# Patient Record
Sex: Female | Born: 1995 | Race: White | Hispanic: No | Smoking: Current some day smoker
Health system: Southern US, Community
[De-identification: ages and names within clinical notes are randomized; demographics above are authoritative.]

## PROBLEM LIST (undated history)

## (undated) DIAGNOSIS — F502 Bulimia nervosa, unspecified: Secondary | ICD-10-CM

## (undated) DIAGNOSIS — K141 Geographic tongue: Secondary | ICD-10-CM

## (undated) DIAGNOSIS — F4522 Body dysmorphic disorder: Secondary | ICD-10-CM

## (undated) HISTORY — PX: OTHER SURGICAL HISTORY: SHX169

---
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
## 2016-01-30 DIAGNOSIS — R519 Headache, unspecified: Secondary | ICD-10-CM | POA: Insufficient documentation

## 2016-01-30 DIAGNOSIS — R51 Headache: Secondary | ICD-10-CM

## 2016-07-31 DIAGNOSIS — O901 Disruption of perineal obstetric wound: Secondary | ICD-10-CM | POA: Insufficient documentation

## 2016-08-01 DIAGNOSIS — Z8759 Personal history of other complications of pregnancy, childbirth and the puerperium: Secondary | ICD-10-CM | POA: Insufficient documentation

## 2017-03-18 ENCOUNTER — Encounter: Payer: Self-pay | Admitting: Obstetrics & Gynecology

## 2017-03-18 ENCOUNTER — Ambulatory Visit (INDEPENDENT_AMBULATORY_CARE_PROVIDER_SITE_OTHER): Payer: Medicaid Other | Admitting: Obstetrics & Gynecology

## 2017-03-18 ENCOUNTER — Other Ambulatory Visit (HOSPITAL_COMMUNITY)
Admission: RE | Admit: 2017-03-18 | Discharge: 2017-03-18 | Disposition: A | Discharge status: Home / Self Care | Payer: Medicaid Other | Source: Ambulatory Visit | Attending: Obstetrics & Gynecology | Admitting: Obstetrics & Gynecology

## 2017-03-18 DIAGNOSIS — Z3481 Encounter for supervision of other normal pregnancy, first trimester: Secondary | ICD-10-CM | POA: Diagnosis not present

## 2017-03-18 DIAGNOSIS — Z3689 Encounter for other specified antenatal screening: Secondary | ICD-10-CM

## 2017-03-18 DIAGNOSIS — Z3490 Encounter for supervision of normal pregnancy, unspecified, unspecified trimester: Secondary | ICD-10-CM | POA: Insufficient documentation

## 2017-03-18 DIAGNOSIS — Z3480 Encounter for supervision of other normal pregnancy, unspecified trimester: Secondary | ICD-10-CM | POA: Diagnosis not present

## 2017-03-18 DIAGNOSIS — Z3A08 8 weeks gestation of pregnancy: Secondary | ICD-10-CM | POA: Insufficient documentation

## 2017-03-18 DIAGNOSIS — I10 Essential (primary) hypertension: Secondary | ICD-10-CM

## 2017-03-18 MED ORDER — DOXYLAMINE-PYRIDOXINE 10-10 MG PO TBEC: 2.0000 | DELAYED_RELEASE_TABLET | Freq: Every day | ORAL | 2 refills | Status: DC

## 2017-03-18 NOTE — Progress Notes (Signed)
BABYSCRIPTS PATIENT: [x ] initial,  12,  20,  28,  32,  36,  38,  39,  40  Strong family history of breast cancer.

## 2017-03-18 NOTE — Progress Notes (Signed)
Bedside U/S shows IUP with FHT of 150 BPM and CRL is 13.6mm  GA [redacted]w[redacted]d.  Pt does desire 1 trimester screening

## 2017-03-19 ENCOUNTER — Encounter: Payer: Self-pay | Admitting: Obstetrics & Gynecology

## 2017-03-19 DIAGNOSIS — O26899 Other specified pregnancy related conditions, unspecified trimester: Secondary | ICD-10-CM

## 2017-03-19 DIAGNOSIS — Z6791 Unspecified blood type, Rh negative: Secondary | ICD-10-CM | POA: Insufficient documentation

## 2017-03-19 LAB — PRENATAL PROFILE (SOLSTAS)
Antibody Screen: NEGATIVE
Basophils Absolute: 0 cells/uL (ref 0–200)
Basophils Relative: 0 %
EOS PCT: 1 %
Eosinophils Absolute: 102 cells/uL (ref 15–500)
HEMATOCRIT: 34.7 % — AB (ref 35.0–45.0)
HEMOGLOBIN: 10.8 g/dL — AB (ref 11.7–15.5)
HIV 1&2 Ab, 4th Generation: NONREACTIVE
Hepatitis B Surface Ag: NEGATIVE
LYMPHS PCT: 13 %
Lymphs Abs: 1326 cells/uL (ref 850–3900)
MCH: 21.4 pg — ABNORMAL LOW (ref 27.0–33.0)
MCHC: 31.1 g/dL — ABNORMAL LOW (ref 32.0–36.0)
MCV: 68.8 fL — ABNORMAL LOW (ref 80.0–100.0)
MPV: 9.3 fL (ref 7.5–12.5)
Monocytes Absolute: 612 cells/uL (ref 200–950)
Monocytes Relative: 6 %
NEUTROS PCT: 80 %
Neutro Abs: 8160 cells/uL — ABNORMAL HIGH (ref 1500–7800)
Platelets: 409 10*3/uL — ABNORMAL HIGH (ref 140–400)
RBC: 5.04 MIL/uL (ref 3.80–5.10)
RDW: 17.9 % — AB (ref 11.0–15.0)
RH TYPE: NEGATIVE
RUBELLA: 2.62 {index} — AB (ref <0.90)
WBC: 10.2 10*3/uL (ref 3.8–10.8)

## 2017-03-20 ENCOUNTER — Encounter: Payer: Self-pay | Admitting: Obstetrics & Gynecology

## 2017-03-20 DIAGNOSIS — Z803 Family history of malignant neoplasm of breast: Secondary | ICD-10-CM | POA: Insufficient documentation

## 2017-03-20 LAB — URINE CYTOLOGY ANCILLARY ONLY
CHLAMYDIA, DNA PROBE: NEGATIVE
NEISSERIA GONORRHEA: NEGATIVE

## 2017-03-20 LAB — CULTURE, OB URINE

## 2017-03-22 NOTE — Progress Notes (Signed)
Subjective:    Katrina Fuller is a G2P1001 [redacted]w[redacted]d being seen today for her first obstetrical visit.  Her obstetrical history is significant for obesity, hitroy of 4th degree laceration and breakdown.  (no incontinence). Pregnancy history fully reviewed.  [facility] has HTN on their problem list but not hypertensive here.  Will watch carefully.  Patient reports no complaints.  Vitals:   03/18/17 0952 03/18/17 0954  BP: 115/76   Pulse: 78   Weight: 172 lb (78 kg)   Height:  5' (1.524 m)    HISTORY: OB History  Gravida Para Term Preterm AB Living  SAB TAB Ectopic Multiple Live Births               # Outcome Date GA Lbr Len/2nd Weight Sex Delivery Anes PTL Lv  2 Current           1 Term 07/23/16   8 lb 1 oz (3.657 kg) M Vag-Spont        History reviewed. No pertinent past medical history. Past Surgical History:  Procedure Laterality Date  . chin surgery     5 surgeries for cyst on chin   Family History  Problem Relation Age of Onset  . Diabetes Mother   . Hypertension Mother   . Diverticulitis Mother   . Multiple sclerosis Father   . Diabetes Father   . Hypertension Father   . Diverticulitis Father   . Breast cancer Sister   . Breast cancer Paternal Grandmother   . Breast cancer Paternal Aunt     4 P aunts with breast cancer     Exam    Uterus:     Pelvic Exam:    Perineum: No Hemorrhoids   Vulva: normal   Vagina:  normal mucosa, normal discharge   pH: n/a   Cervix: no lesions   Adnexa: normal adnexa   Bony Pelvis: average  System: Breast:  normal appearance, no masses or tenderness   Skin: normal coloration and turgor, no rashes    Neurologic: oriented, normal mood   Extremities: normal strength, tone, and muscle mass   HEENT sclera clear, anicteric, oropharynx clear, no lesions, neck supple with midline trachea, thyroid without masses and trachea midline   Mouth/Teeth mucous membranes moist, pharynx normal without lesions and dental hygiene poor    Neck supple and no masses   Cardiovascular: regular rate and rhythm   Respiratory:  appears well, vitals normal, no respiratory distress, acyanotic, normal RR, chest clear, no wheezing, crepitations, rhonchi, normal symmetric air entry   Abdomen: soft, non-tender; bowel sounds normal; no masses,  no organomegaly   Urinary: urethral meatus normal      Assessment:    Pregnancy: G2P1001 Patient Active Problem List   Diagnosis Date Noted  . Family history of breast cancer 03/20/2017  . Rh negative state in antepartum period 03/19/2017  . Supervision of other normal pregnancy, antepartum 03/18/2017  . Hypertension 08/01/2016  . Fourth degree laceration of perineum during delivery, postpartum 07/31/2016  . Postpartum episiotomy dehiscence 07/31/2016  . Nonintractable episodic headache 01/30/2016        Plan:     Initial labs drawn. Prenatal vitamins. Problem list reviewed and updated. Genetic Screening discussed First Screen: ordered.  Ultrasound discussed; fetal survey: requested.  Follow up in 4 weeks.  Ask patient more details about family breast cancer Babyscripts reduced visit Dental referral [facility]--?Hyertention on their problem list.  Will watch carefully and inquire at  next visit.     Katrina Fuller 03/22/2017

## 2017-03-26 ENCOUNTER — Encounter: Payer: Self-pay | Admitting: Obstetrics & Gynecology

## 2017-03-30 ENCOUNTER — Encounter: Payer: Self-pay | Admitting: *Deleted

## 2017-04-15 ENCOUNTER — Ambulatory Visit (INDEPENDENT_AMBULATORY_CARE_PROVIDER_SITE_OTHER): Payer: Medicaid Other | Admitting: Obstetrics & Gynecology

## 2017-04-15 ENCOUNTER — Encounter: Payer: Self-pay | Admitting: Obstetrics & Gynecology

## 2017-04-15 VITALS — BP 111/77 | HR 80 | Wt 165.0 lb

## 2017-04-15 DIAGNOSIS — Z3481 Encounter for supervision of other normal pregnancy, first trimester: Secondary | ICD-10-CM

## 2017-04-15 DIAGNOSIS — Z8632 Personal history of gestational diabetes: Secondary | ICD-10-CM

## 2017-04-15 DIAGNOSIS — Z3480 Encounter for supervision of other normal pregnancy, unspecified trimester: Secondary | ICD-10-CM

## 2017-04-15 NOTE — Progress Notes (Signed)
   PRENATAL VISIT NOTE  Subjective:  Katrina Fuller is a 21 y.o. G2P1001 at 8465w5d being seen today for ongoing prenatal care.  She is currently monitored for the following issues for this low-risk pregnancy and has Supervision of other normal pregnancy, antepartum; Rh negative state in antepartum period; Family history of breast cancer; Fourth degree laceration of perineum during delivery, postpartum; Nonintractable episodic headache; Postpartum episiotomy dehiscence; Hypertension; and History of diet controlled gestational diabetes mellitus (GDM) on her problem list.  Patient reports no complaints.  Contractions: Not present. Vag. Bleeding: None.  Movement: Absent. Denies leaking of fluid.   The following portions of the patient's history were reviewed and updated as appropriate: allergies, current medications, past family history, past medical history, past social history, past surgical history and problem list. Problem list updated.  Objective:   Vitals:   04/15/17 0858  BP: 111/77  Pulse: 80  Weight: 165 lb (74.8 kg)    Fetal Status: Fetal Heart Rate (bpm): 159   Movement: Absent     General:  Alert, oriented and cooperative. Patient is in no acute distress.  Skin: Skin is warm and dry. No rash noted.   Cardiovascular: Normal heart rate noted  Respiratory: Normal respiratory effort, no problems with respiration noted  Abdomen: Soft, gravid, appropriate for gestational age. Pain/Pressure: Absent     Pelvic:  Cervical exam deferred        Extremities: Normal range of motion.  Edema: None  Mental Status: Normal mood and affect. Normal behavior. Normal judgment and thought content.   Assessment and Plan:  Pregnancy: G2P1001 at 465w5d  1. Supervision of other normal pregnancy, antepartum - We discussed genetic testing. She declines.  2. History of diet controlled gestational diabetes mellitus (GDM)  - Hemoglobin A1c  Preterm labor symptoms and general obstetric precautions  including but not limited to vaginal bleeding, contractions, leaking of fluid and fetal movement were reviewed in detail with the patient. Please refer to After Visit Summary for other counseling recommendations.  Return in about 4 weeks (around 05/13/2017).   Allie Bossierove, Ledora Delker C, MD

## 2017-04-16 LAB — HEMOGLOBIN A1C
HEMOGLOBIN A1C: 5.3 % (ref <5.7)
Mean Plasma Glucose: 105 mg/dL

## 2017-05-05 ENCOUNTER — Other Ambulatory Visit: Payer: Self-pay

## 2017-05-05 ENCOUNTER — Encounter: Payer: Self-pay | Admitting: Obstetrics & Gynecology

## 2017-05-05 DIAGNOSIS — Z3A20 20 weeks gestation of pregnancy: Secondary | ICD-10-CM

## 2017-05-26 ENCOUNTER — Encounter: Payer: Self-pay | Admitting: Obstetrics & Gynecology

## 2017-06-08 ENCOUNTER — Ambulatory Visit (HOSPITAL_COMMUNITY)
Admission: RE | Admit: 2017-06-08 | Discharge: 2017-06-08 | Disposition: A | Discharge status: Home / Self Care | Payer: Medicaid Other | Source: Ambulatory Visit | Attending: Obstetrics & Gynecology | Admitting: Obstetrics & Gynecology

## 2017-06-08 ENCOUNTER — Ambulatory Visit (HOSPITAL_COMMUNITY): Payer: Medicaid Other

## 2017-06-08 ENCOUNTER — Other Ambulatory Visit: Payer: Self-pay | Admitting: Obstetrics & Gynecology

## 2017-06-08 DIAGNOSIS — Z3A20 20 weeks gestation of pregnancy: Secondary | ICD-10-CM

## 2017-06-08 DIAGNOSIS — O99212 Obesity complicating pregnancy, second trimester: Secondary | ICD-10-CM

## 2017-06-08 DIAGNOSIS — Z3A19 19 weeks gestation of pregnancy: Secondary | ICD-10-CM | POA: Diagnosis not present

## 2017-06-08 DIAGNOSIS — Z6832 Body mass index (BMI) 32.0-32.9, adult: Secondary | ICD-10-CM | POA: Insufficient documentation

## 2017-06-08 DIAGNOSIS — Z363 Encounter for antenatal screening for malformations: Secondary | ICD-10-CM

## 2017-06-10 ENCOUNTER — Ambulatory Visit (INDEPENDENT_AMBULATORY_CARE_PROVIDER_SITE_OTHER): Payer: Medicaid Other | Admitting: Obstetrics & Gynecology

## 2017-06-10 ENCOUNTER — Encounter: Payer: Self-pay | Admitting: Obstetrics & Gynecology

## 2017-06-10 VITALS — BP 95/63 | HR 91 | Wt 164.0 lb

## 2017-06-10 DIAGNOSIS — Z8632 Personal history of gestational diabetes: Secondary | ICD-10-CM

## 2017-06-10 DIAGNOSIS — Z8759 Personal history of other complications of pregnancy, childbirth and the puerperium: Secondary | ICD-10-CM

## 2017-06-10 DIAGNOSIS — Z0489 Encounter for examination and observation for other specified reasons: Secondary | ICD-10-CM

## 2017-06-10 DIAGNOSIS — IMO0002 Reserved for concepts with insufficient information to code with codable children: Secondary | ICD-10-CM

## 2017-06-10 DIAGNOSIS — Z3402 Encounter for supervision of normal first pregnancy, second trimester: Secondary | ICD-10-CM

## 2017-06-10 MED ORDER — ASPIRIN EC 81 MG PO TBEC: 81.0000 mg | DELAYED_RELEASE_TABLET | Freq: Every day | ORAL | 2 refills | Status: DC

## 2017-06-10 NOTE — Progress Notes (Signed)
PRENATAL VISIT NOTE  Subjective:  Katrina Fuller is a 21 y.o. G2P1001 at 3554w5d being seen today for ongoing prenatal care.  She is currently monitored for the following issues for this low-risk pregnancy and has Supervision of normal pregnancy; Rh negative state in antepartum period; Family history of breast cancer; Fourth degree laceration of perineum during delivery, postpartum; Nonintractable episodic headache; Postpartum episiotomy dehiscence; History of gestational hypertension; and History of diet controlled gestational diabetes mellitus (GDM) on her problem list.  Patient reports no complaints.  Contractions: Not present. Vag. Bleeding: None.  Movement: Present. Denies leaking of fluid.   The following portions of the patient's history were reviewed and updated as appropriate: allergies, current medications, past family history, past medical history, past social history, past surgical history and problem list. Problem list updated.  Objective:   Vitals:   06/10/17 0835  BP: 95/63  Pulse: 91  Weight: 164 lb (74.4 kg)    Fetal Status: Fetal Heart Rate (bpm): 145 Fundal Height: 20 cm Movement: Present     General:  Alert, oriented and cooperative. Patient is in no acute distress.  Skin: Skin is warm and dry. No rash noted.   Cardiovascular: Normal heart rate noted  Respiratory: Normal respiratory effort, no problems with respiration noted  Abdomen: Soft, gravid, appropriate for gestational age. Pain/Pressure: Absent     Pelvic:  Cervical exam deferred        Extremities: Normal range of motion.     Mental Status: Normal mood and affect. Normal behavior. Normal judgment and thought content.   [place] Mfm Ob Detail +14 Wk  Result Date: 06/08/2017 ----------------------------------------------------------------------  OBSTETRICS REPORT                      (Signed Final 06/08/2017 06:37 pm) ---------------------------------------------------------------------- Patient Info  ID #:        [id number]                         D.O.B.:   May 23, 1996 (20 yrs)  Name:       Katrina Fuller                 Visit Date:  06/08/2017 04:17 pm ---------------------------------------------------------------------- Performed By  Performed By:     Emeline DarlingKasie E Kiser BS,      Ref. Address:     [address]                    [address]                                                             [address]                                                             [place], [place]  [address]  Attending:        Particia Nearing MD       Location:         [facility]  Referred By:      Tereso Newcomer MD ---------------------------------------------------------------------- Orders   #  Description                                 Code   1  [place] MFM OB DETAIL +14 WK                     76811.01  ----------------------------------------------------------------------   #  Ordered By               Order #        Accession #    Episode #   1  Jaynie Collins           [id number]       [id number]     [id number]  ---------------------------------------------------------------------- Indications   [redacted] weeks gestation of pregnancy                Z3A.19   Encounter for antenatal screening for          Z36.3   malformations   Obesity complicating pregnancy, second         O99.212   trimester  ---------------------------------------------------------------------- OB History  Blood Type:            Height:  5'0"   Weight (lb):  165      BMI:   32.22  Gravidity:    2         Term:   1        Prem:   0        SAB:   0  TOP:          0       Ectopic:  0        Living: 1 ---------------------------------------------------------------------- Fetal Evaluation  Num Of Fetuses:     1  Fetal Heart         150  Rate(bpm):  Cardiac Activity:   Observed  Presentation:       Breech  Placenta:           Anterior, above cervical os  P. Cord Insertion:  Visualized  Amniotic Fluid  AFI FV:       Subjectively within normal limits                              Largest Pocket(cm)                              4.8 ---------------------------------------------------------------------- Biometry  BPD:      44.2  mm     G. Age:  19w 3d         48  %    CI:         68.7   %   70 - 86  FL/HC:      16.9   %   16.1 - 18.3  HC:      170.4  mm     G. Age:  19w 4d         53  %    HC/AC:      1.15       1.09 - 1.39  AC:      147.8  mm     G. Age:  20w 1d         66  %    FL/BPD:     65.2   %  FL:       28.8  mm     G. Age:  18w 6d         23  %    FL/AC:      19.5   %   20 - 24  NFT:       4.6  mm  Est. FW:     296  gm    0 lb 10 oz      48  % ---------------------------------------------------------------------- Gestational Age  LMP:           25w 0d       Date:   12/15/16                 EDD:   09/21/17  U/S Today:     19w 4d                                        EDD:   10/29/17  Best:          19w 3d    Det. ByMarcella Dubs         EDD:   10/30/17                                      (03/18/17) ---------------------------------------------------------------------- Anatomy  Cranium:               Appears normal         Aortic Arch:            Appears normal  Cavum:                 Appears normal         Ductal Arch:            Appears normal  Ventricles:            Appears normal         Diaphragm:              Appears normal  Choroid Plexus:        Appears normal         Stomach:                Appears normal, left                                                                        sided  Cerebellum:  Appears normal         Abdomen:                Appears normal  Posterior Fossa:       Appears normal         Abdominal Wall:         Appears nml (cord                                                                        insert, abd wall)  Nuchal Fold:           Appears normal         Cord Vessels:           Appears normal (3                                                                         vessel cord)  Face:                  Appears normal         Kidneys:                Appear normal                         (orbits and profile)  Lips:                  Appears normal         Bladder:                Appears normal  Thoracic:              Appears normal         Spine:                  Not well visualized  Heart:                 Appears normal         Upper Extremities:      Appears normal                         (4CH, axis, and situs  RVOT:                  Appears normal         Lower Extremities:      Not well visualized  LVOT:                  Appears normal  Other:  Female gender. Heels and 5th digit visualized. Technically difficult          due to maternal habitus and fetal position. ---------------------------------------------------------------------- Cervix Uterus Adnexa  Cervix  Length:            3.9  cm.  Normal appearance by transabdominal scan.  Adnexa:  No abnormality visualized. ---------------------------------------------------------------------- Impression  SIUP at 19+3 weeks  Normal detailed fetal anatomy; limited views of spine  Markers of aneuploidy: none  Normal amniotic fluid volume  Measurements consistent with prior US ---------------------------------------------------------------------- Recommendations  Follow-up ultrasound in 4-6 weeks to complete anatomy  survey or follow-up as clinically indicated ----------------------------------------------------------------------                 Particia Nearing, MD Electronically Signed Final Report   06/08/2017 06:37 pm ----------------------------------------------------------------------   Assessment and Plan:  Pregnancy: G2P1001 at [redacted]w[redacted]d  1. History of gestational hypertension Happened in last month of previous pregnancy; she does not think she had preeclampsia. Aspirin 81 mg prescribed this pregnancy.  - aspirin EC 81 MG tablet; Take 1 tablet (81 mg total) by mouth  daily. Take after 12 weeks for prevention of preeclampsia later in pregnancy  Dispense: 300 tablet; Refill: 2  2. Evaluate anatomy not seen on prior sonogram Limited spine on previous scan. - [place] MFM OB FOLLOW UP; Future  3. Encounter for supervision of normal first pregnancy in second trimester Declines quad screen or other genetic screening. Preterm labor symptoms and general obstetric precautions including but not limited to vaginal bleeding, contractions, leaking of fluid and fetal movement were reviewed in detail with the patient. Please refer to After Visit Summary for other counseling recommendations.  Return in about 4 weeks (around 07/08/2017) for OB Visit.   Jaynie Collins, MD

## 2017-06-10 NOTE — Patient Instructions (Signed)
Return to clinic for any scheduled appointments or obstetric concerns, or go to [facility] for evaluation    Second Trimester of Pregnancy The second trimester is from week 14 through week 27 (months 4 through 6). The second trimester is often a time when you feel your best. Your body has adjusted to being pregnant, and you begin to feel better physically. Usually, morning sickness has lessened or quit completely, you may have more energy, and you may have an increase in appetite. The second trimester is also a time when the fetus is growing rapidly. At the end of the sixth month, the fetus is about 9 inches long and weighs about 1 pounds. You will likely begin to feel the baby move (quickening) between 16 and 20 weeks of pregnancy. Body changes during your second trimester Your body continues to go through many changes during your second trimester. The changes vary from woman to woman.  Your weight will continue to increase. You will notice your lower abdomen bulging out.  You may begin to get stretch marks on your hips, abdomen, and breasts.  You may develop headaches that can be relieved by medicines. The medicines should be approved by your health care provider.  You may urinate more often because the fetus is pressing on your bladder.  You may develop or continue to have heartburn as a result of your pregnancy.  You may develop constipation because certain hormones are causing the muscles that push waste through your intestines to slow down.  You may develop hemorrhoids or swollen, bulging veins (varicose veins).  You may have back pain. This is caused by: ? Weight gain. ? Pregnancy hormones that are relaxing the joints in your pelvis. ? A shift in weight and the muscles that support your balance.  Your breasts will continue to grow and they will continue to become tender.  Your gums may bleed and may be sensitive to brushing and flossing.  Dark spots or blotches (chloasma, mask of  pregnancy) may develop on your face. This will likely fade after the baby is born.  A dark line from your belly button to the pubic area (linea nigra) may appear. This will likely fade after the baby is born.  You may have changes in your hair. These can include thickening of your hair, rapid growth, and changes in texture. Some women also have hair loss during or after pregnancy, or hair that feels dry or thin. Your hair will most likely return to normal after your baby is born.  What to expect at prenatal visits During a routine prenatal visit:  You will be weighed to make sure you and the fetus are growing normally.  Your blood pressure will be taken.  Your abdomen will be measured to track your baby's growth.  The fetal heartbeat will be listened to.  Any test results from the previous visit will be discussed.  Your health care provider may ask you:  How you are feeling.  If you are feeling the baby move.  If you have had any abnormal symptoms, such as leaking fluid, bleeding, severe headaches, or abdominal cramping.  If you are using any tobacco products, including cigarettes, chewing tobacco, and electronic cigarettes.  If you have any questions.  Other tests that may be performed during your second trimester include:  Blood tests that check for: ? Low iron levels (anemia). ? High blood sugar that affects pregnant women (gestational diabetes) between 24 and 28 weeks. ? Rh antibodies. This is to   check for a protein on red blood cells (Rh factor).  Urine tests to check for infections, diabetes, or protein in the urine.  An ultrasound to confirm the proper growth and development of the baby.  An amniocentesis to check for possible genetic problems.  Fetal screens for spina bifida and Down syndrome.  HIV (human immunodeficiency virus) testing. Routine prenatal testing includes screening for HIV, unless you choose not to have this test.  Follow these instructions at  home: Medicines  Follow your health care provider's instructions regarding medicine use. Specific medicines may be either safe or unsafe to take during pregnancy.  Take a prenatal vitamin that contains at least 600 micrograms (mcg) of folic acid.  If you develop constipation, try taking a stool softener if your health care provider approves. Eating and drinking  Eat a balanced diet that includes fresh fruits and vegetables, whole grains, good sources of protein such as meat, eggs, or tofu, and low-fat dairy. Your health care provider will help you determine the amount of weight gain that is right for you.  Avoid raw meat and uncooked cheese. These carry germs that can cause birth defects in the baby.  If you have low calcium intake from food, talk to your health care provider about whether you should take a daily calcium supplement.  Limit foods that are high in fat and processed sugars, such as fried and sweet foods.  To prevent constipation: ? Drink enough fluid to keep your urine clear or pale yellow. ? Eat foods that are high in fiber, such as fresh fruits and vegetables, whole grains, and beans. Activity  Exercise only as directed by your health care provider. Most women can continue their usual exercise routine during pregnancy. Try to exercise for 30 minutes at least 5 days a week. Stop exercising if you experience uterine contractions.  Avoid heavy lifting, wear low heel shoes, and practice good posture.  A sexual relationship may be continued unless your health care provider directs you otherwise. Relieving pain and discomfort  Wear a good support bra to prevent discomfort from breast tenderness.  Take warm sitz baths to soothe any pain or discomfort caused by hemorrhoids. Use hemorrhoid cream if your health care provider approves.  Rest with your legs elevated if you have leg cramps or low back pain.  If you develop varicose veins, wear support hose. Elevate your feet  for 15 minutes, 3-4 times a day. Limit salt in your diet. Prenatal Care  Write down your questions. Take them to your prenatal visits.  Keep all your prenatal visits as told by your health care provider. This is important. Safety  Wear your seat belt at all times when driving.  Make a list of emergency phone numbers, including numbers for family, friends, the hospital, and police and fire departments. General instructions  Ask your health care provider for a referral to a local prenatal education class. Begin classes no later than the beginning of month 6 of your pregnancy.  Ask for help if you have counseling or nutritional needs during pregnancy. Your health care provider can offer advice or refer you to specialists for help with various needs.  Do not use hot tubs, steam rooms, or saunas.  Do not douche or use tampons or scented sanitary pads.  Do not cross your legs for long periods of time.  Avoid cat litter boxes and soil used by cats. These carry germs that can cause birth defects in the baby and possibly loss of the   fetus by miscarriage or stillbirth.  Avoid all smoking, herbs, alcohol, and unprescribed drugs. Chemicals in these products can affect the formation and growth of the baby.  Do not use any products that contain nicotine or tobacco, such as cigarettes and e-cigarettes. If you need help quitting, ask your health care provider.  Visit your dentist if you have not gone yet during your pregnancy. Use a soft toothbrush to brush your teeth and be gentle when you floss. Contact a health care provider if:  You have dizziness.  You have mild pelvic cramps, pelvic pressure, or nagging pain in the abdominal area.  You have persistent nausea, vomiting, or diarrhea.  You have a bad smelling vaginal discharge.  You have pain when you urinate. Get help right away if:  You have a fever.  You are leaking fluid from your vagina.  You have spotting or bleeding from your  vagina.  You have severe abdominal cramping or pain.  You have rapid weight gain or weight loss.  You have shortness of breath with chest pain.  You notice sudden or extreme swelling of your face, hands, ankles, feet, or legs.  You have not felt your baby move in over an hour.  You have severe headaches that do not go away when you take medicine.  You have vision changes. Summary  The second trimester is from week 14 through week 27 (months 4 through 6). It is also a time when the fetus is growing rapidly.  Your body goes through many changes during pregnancy. The changes vary from woman to woman.  Avoid all smoking, herbs, alcohol, and unprescribed drugs. These chemicals affect the formation and growth your baby.  Do not use any tobacco products, such as cigarettes, chewing tobacco, and e-cigarettes. If you need help quitting, ask your health care provider.  Contact your health care provider if you have any questions. Keep all prenatal visits as told by your health care provider. This is important. This information is not intended to replace advice given to you by your health care provider. Make sure you discuss any questions you have with your health care provider. Document Released: 11/11/2001 Document Revised: 04/24/2016 Document Reviewed: 01/18/2013 [employer] Interactive Patient Education  2017 [employer]  

## 2017-07-08 ENCOUNTER — Encounter: Payer: Medicaid Other | Admitting: Obstetrics & Gynecology

## 2017-07-10 ENCOUNTER — Ambulatory Visit (HOSPITAL_COMMUNITY)
Admission: RE | Admit: 2017-07-10 | Discharge: 2017-07-10 | Disposition: A | Discharge status: Home / Self Care | Payer: Medicaid Other | Source: Ambulatory Visit | Attending: Obstetrics & Gynecology | Admitting: Obstetrics & Gynecology

## 2017-07-10 DIAGNOSIS — Z048 Encounter for examination and observation for other specified reasons: Secondary | ICD-10-CM | POA: Insufficient documentation

## 2017-07-10 DIAGNOSIS — Z3A24 24 weeks gestation of pregnancy: Secondary | ICD-10-CM | POA: Insufficient documentation

## 2017-07-10 DIAGNOSIS — Z0489 Encounter for examination and observation for other specified reasons: Secondary | ICD-10-CM

## 2017-07-10 DIAGNOSIS — IMO0002 Reserved for concepts with insufficient information to code with codable children: Secondary | ICD-10-CM

## 2017-07-15 ENCOUNTER — Encounter: Payer: Self-pay | Admitting: Obstetrics & Gynecology

## 2017-07-15 ENCOUNTER — Ambulatory Visit (INDEPENDENT_AMBULATORY_CARE_PROVIDER_SITE_OTHER): Payer: Medicaid Other | Admitting: Obstetrics & Gynecology

## 2017-07-15 DIAGNOSIS — Z3482 Encounter for supervision of other normal pregnancy, second trimester: Secondary | ICD-10-CM

## 2017-07-15 DIAGNOSIS — Z3480 Encounter for supervision of other normal pregnancy, unspecified trimester: Secondary | ICD-10-CM

## 2017-07-15 NOTE — Progress Notes (Signed)
   PRENATAL VISIT NOTE  Subjective:  Ellouise NewerHannah Manny is a 21 y.o. G2P1001 at 2834w5d being seen today for ongoing prenatal care.  She is currently monitored for the following issues for this low-risk pregnancy and has Supervision of normal pregnancy; Rh negative state in antepartum period; Family history of breast cancer; Fourth degree laceration of perineum during delivery, postpartum; Nonintractable episodic headache; Postpartum episiotomy dehiscence; History of gestational hypertension; and History of diet controlled gestational diabetes mellitus (GDM) on her problem list.  Patient reports no complaints.  Contractions: Not present. Vag. Bleeding: None.  Movement: Present. Denies leaking of fluid.   The following portions of the patient's history were reviewed and updated as appropriate: allergies, current medications, past family history, past medical history, past social history, past surgical history and problem list. Problem list updated.  Objective:   Vitals:   07/15/17 0855  BP: 103/64  Pulse: 96  Weight: 169 lb (76.7 kg)    Fetal Status: Fetal Heart Rate (bpm): 150   Movement: Present     General:  Alert, oriented and cooperative. Patient is in no acute distress.  Skin: Skin is warm and dry. No rash noted.   Cardiovascular: Normal heart rate noted  Respiratory: Normal respiratory effort, no problems with respiration noted  Abdomen: Soft, gravid, appropriate for gestational age.  Pain/Pressure: Absent     Pelvic: Cervical exam deferred        Extremities: Normal range of motion.  Edema: None  Mental Status:  Normal mood and affect. Normal behavior. Normal judgment and thought content.   Assessment and Plan:  Pregnancy: G2P1001 at 5134w5d  1. Supervision of other normal pregnancy, antepartum Taking BP, but not as often.  Pt's last recording is 7/31.  Pt will take 1-2 times per week.   RN to check database next Friday to see compliance Pt wants Brachs jelly beans GDM test.   Will  do at 28 weeks Tdap and Rhogam next visit.   Preterm labor symptoms and general obstetric precautions including but not limited to vaginal bleeding, contractions, leaking of fluid and fetal movement were reviewed in detail with the patient. Please refer to After Visit Summary for other counseling recommendations.   RTC 4 weeks.  Elsie LincolnKelly Mathea Frieling, MD

## 2017-07-21 ENCOUNTER — Ambulatory Visit (INDEPENDENT_AMBULATORY_CARE_PROVIDER_SITE_OTHER): Payer: Medicaid Other | Admitting: Obstetrics and Gynecology

## 2017-07-21 ENCOUNTER — Encounter: Payer: Self-pay | Admitting: *Deleted

## 2017-07-21 ENCOUNTER — Encounter: Payer: Self-pay | Admitting: Obstetrics & Gynecology

## 2017-07-21 VITALS — BP 101/70 | HR 83 | Temp 97.3°F | Wt 171.0 lb

## 2017-07-21 DIAGNOSIS — O09899 Supervision of other high risk pregnancies, unspecified trimester: Secondary | ICD-10-CM

## 2017-07-21 DIAGNOSIS — Z3482 Encounter for supervision of other normal pregnancy, second trimester: Secondary | ICD-10-CM

## 2017-07-21 DIAGNOSIS — O26899 Other specified pregnancy related conditions, unspecified trimester: Secondary | ICD-10-CM

## 2017-07-21 DIAGNOSIS — Z6791 Unspecified blood type, Rh negative: Secondary | ICD-10-CM

## 2017-07-21 DIAGNOSIS — R319 Hematuria, unspecified: Secondary | ICD-10-CM

## 2017-07-21 DIAGNOSIS — Z8632 Personal history of gestational diabetes: Secondary | ICD-10-CM

## 2017-07-21 DIAGNOSIS — Z8759 Personal history of other complications of pregnancy, childbirth and the puerperium: Secondary | ICD-10-CM

## 2017-07-21 MED ORDER — AMOXICILLIN-POT CLAVULANATE 875-125 MG PO TABS: 1.0000 | ORAL_TABLET | Freq: Two times a day (BID) | ORAL | 1 refills | Status: DC

## 2017-07-21 NOTE — Progress Notes (Signed)
   PRENATAL VISIT NOTE  Subjective:  Katrina Fuller is a 21 y.o. G2P1001 at [redacted]w[redacted]d being seen today for ongoing prenatal care.  She is currently monitored for the following issues for this low-risk pregnancy and has Supervision of normal pregnancy; Rh negative state in antepartum period; Family history of breast cancer; Fourth degree laceration of perineum during delivery, postpartum; Nonintractable episodic headache; Postpartum episiotomy dehiscence; History of gestational hypertension; and History of diet controlled gestational diabetes mellitus (GDM) on her problem list.  Patient reports tooth ache worst since last night. Patient with history of poor dentition with painful inferior right molar. Patient has been drinking protein shakes as it is to painful to chew .  Contractions: Regular. Vag. Bleeding: None.  Movement: Present. Denies leaking of fluid.   The following portions of the patient's history were reviewed and updated as appropriate: allergies, current medications, past family history, past medical history, past social history, past surgical history and problem list. Problem list updated.  Objective:   Vitals:   07/21/17 1313  BP: 101/70  Pulse: 83  Temp: (!) 97.3 F (36.3 C)  Weight: 171 lb (77.6 kg)    Fetal Status: Fetal Heart Rate (bpm): 151 Fundal Height: 26 cm Movement: Present     General:  Alert, oriented and cooperative. Patient is in no acute distress.  Skin: Skin is warm and dry. No rash noted.   Cardiovascular: Normal heart rate noted  Respiratory: Normal respiratory effort, no problems with respiration noted  Abdomen: Soft, gravid, appropriate for gestational age.  Pain/Pressure: Absent     Pelvic: Cervical exam deferred        Extremities: Normal range of motion.  Edema: None  Mental Status:  Normal mood and affect. Normal behavior. Normal judgment and thought content.  MOUTH: Complete erosion of tooth enamel on inferior right molar with associated  gingivits  Assessment and Plan:  Pregnancy: G2P1001 at [redacted]w[redacted]d  1. Encounter for supervision of other normal pregnancy in second trimester Rx Augmentin provided - Patient to follow up with dentist on Thursday - Continue liquid diet  2. History of diet controlled gestational diabetes mellitus (GDM) glucola next visit  3. History of gestational hypertension Continue ASA  4. Rh negative state in antepartum period Rhogam at next visit  5. Hematuria, unspecified type - CULTURE, URINE COMPREHENSIVE  Preterm labor symptoms and general obstetric precautions including but not limited to vaginal bleeding, contractions, leaking of fluid and fetal movement were reviewed in detail with the patient. Please refer to After Visit Summary for other counseling recommendations.  No Follow-up on file.   Catalina Antigua, MD

## 2017-07-21 NOTE — Progress Notes (Signed)
Poss abscess tooth,  Dentist appt not until next Thursday.  Urine dip mod-blood

## 2017-07-23 LAB — CULTURE, URINE COMPREHENSIVE: ORGANISM ID, BACTERIA: NO GROWTH

## 2017-08-12 ENCOUNTER — Ambulatory Visit (INDEPENDENT_AMBULATORY_CARE_PROVIDER_SITE_OTHER): Payer: Medicaid Other | Admitting: Obstetrics & Gynecology

## 2017-08-12 DIAGNOSIS — Z3483 Encounter for supervision of other normal pregnancy, third trimester: Secondary | ICD-10-CM

## 2017-08-12 DIAGNOSIS — Z3482 Encounter for supervision of other normal pregnancy, second trimester: Secondary | ICD-10-CM

## 2017-08-12 NOTE — Progress Notes (Signed)
Last three BP readings on babyscripts: 1) 108/74 2) 99/75 3) 97/66    PRENATAL VISIT NOTE  Subjective:  Ellouise NewerHannah Greenfeld is a 21 y.o. G2P1001 at 4166w5d being seen today for ongoing prenatal care.  She is currently monitored for the following issues for this high-risk pregnancy and has Supervision of normal pregnancy; Rh negative state in antepartum period; Family history of breast cancer; Fourth degree laceration of perineum during delivery, postpartum; Nonintractable episodic headache; Postpartum episiotomy dehiscence; History of gestational hypertension; and History of diet controlled gestational diabetes mellitus (GDM) on her problem list.  Patient reports vaginal pain (from 4th degree lac--chronic).  Contractions: Not present. Vag. Bleeding: None.  Movement: Present. Denies leaking of fluid.   The following portions of the patient's history were reviewed and updated as appropriate: allergies, current medications, past family history, past medical history, past social history, past surgical history and problem list. Problem list updated.  Objective:   Vitals:   08/12/17 0825  BP: 110/68  Pulse: 90  Weight: 169 lb (76.7 kg)    Fetal Status: Fetal Heart Rate (bpm): 145 Fundal Height: 29 cm Movement: Present     General:  Alert, oriented and cooperative. Patient is in no acute distress.  Skin: Skin is warm and dry. No rash noted.   Cardiovascular: Normal heart rate noted  Respiratory: Normal respiratory effort, no problems with respiration noted  Abdomen: Soft, gravid, appropriate for gestational age.  Pain/Pressure: Present     Pelvic: Cervical exam deferred        Extremities: Normal range of motion.  Edema: None  Mental Status:  Normal mood and affect. Normal behavior. Normal judgment and thought content.   Assessment and Plan:  Pregnancy: G2P1001 at 6066w5d  1. Encounter for supervision of other normal pregnancy in second trimester Jelly bean glucola 50 g Pt stopped ASA  herself about 2 weeks ago.  Pt encouraged to restart to prevent Pre E which she had during her last pregnancy.  Reviewed records and had severe range pressures post partum.    2. Fourth degree laceration of perineum during delivery, postpartum and bleeding 7 days post partum requiring repair. Chronic pain causing dyspareunia.  She desires primary c/s.  Will schedule at 39 weeks.   Pt is excellent candidate for pelvic PT post partum.  She will have [employer] insurance at that time.    Preterm labor symptoms and general obstetric precautions including but not limited to vaginal bleeding, contractions, leaking of fluid and fetal movement were reviewed in detail with the patient. Please refer to After Visit Summary for other counseling recommendations.  Return in about 4 weeks (around 09/09/2017).   Elsie LincolnKelly Schuyler Behan, MD

## 2017-08-12 NOTE — Progress Notes (Signed)
Pt states she has a lot of pain in vaginal area

## 2017-08-12 NOTE — Addendum Note (Signed)
Addended by: Kathie DikeSOLA, [place] Bohac J on: 08/12/2017 10:03 AM   Modules accepted: Orders

## 2017-08-13 ENCOUNTER — Encounter: Payer: Self-pay | Admitting: Obstetrics & Gynecology

## 2017-08-13 LAB — CBC
HEMATOCRIT: 31.7 % — AB (ref 35.0–45.0)
HEMOGLOBIN: 9.6 g/dL — AB (ref 11.7–15.5)
MCH: 21.1 pg — AB (ref 27.0–33.0)
MCHC: 30.3 g/dL — AB (ref 32.0–36.0)
MCV: 69.5 fL — ABNORMAL LOW (ref 80.0–100.0)
MPV: 10.3 fL (ref 7.5–12.5)
Platelets: 357 10*3/uL (ref 140–400)
RBC: 4.56 10*6/uL (ref 3.80–5.10)
RDW: 15.1 % — ABNORMAL HIGH (ref 11.0–15.0)
WBC: 11 10*3/uL — ABNORMAL HIGH (ref 3.8–10.8)

## 2017-08-13 LAB — RPR: RPR: NONREACTIVE

## 2017-08-13 LAB — GLUCOSE TOLERANCE, 1 HOUR (50G) W/O FASTING: GLUCOSE, 1 HR, GESTATIONAL: 126 mg/dL (ref 65–139)

## 2017-08-13 LAB — HIV ANTIBODY (ROUTINE TESTING W REFLEX): HIV: NONREACTIVE

## 2017-08-27 ENCOUNTER — Encounter: Payer: Self-pay | Admitting: Obstetrics & Gynecology

## 2017-08-27 ENCOUNTER — Telehealth: Payer: Self-pay | Admitting: *Deleted

## 2017-08-27 DIAGNOSIS — O99019 Anemia complicating pregnancy, unspecified trimester: Secondary | ICD-10-CM | POA: Insufficient documentation

## 2017-08-27 NOTE — Telephone Encounter (Signed)
LM on voicemail of declining Hgb and recommending that she start FeS04 BID along with Colace.

## 2017-08-27 NOTE — Telephone Encounter (Signed)
-----   Message from Lesly Dukes, MD sent at 08/27/2017  6:16 AM EDT ----- Pt has worsening anemia.  Ferrous sulfate 325 mg bid with colace.  RN to call patient.  Recheck CBC in 4-6 weeks

## 2017-09-09 ENCOUNTER — Other Ambulatory Visit: Payer: Self-pay | Admitting: Obstetrics and Gynecology

## 2017-09-09 ENCOUNTER — Encounter: Payer: Self-pay | Admitting: Obstetrics & Gynecology

## 2017-09-09 ENCOUNTER — Ambulatory Visit (INDEPENDENT_AMBULATORY_CARE_PROVIDER_SITE_OTHER): Payer: Medicaid Other | Admitting: Obstetrics and Gynecology

## 2017-09-09 ENCOUNTER — Encounter (HOSPITAL_COMMUNITY): Payer: Self-pay

## 2017-09-09 VITALS — BP 89/59 | HR 73 | Wt 178.0 lb

## 2017-09-09 DIAGNOSIS — Z3483 Encounter for supervision of other normal pregnancy, third trimester: Secondary | ICD-10-CM

## 2017-09-09 MED ORDER — COMFORT FIT MATERNITY SUPP MED MISC: 0 refills | Status: DC

## 2017-09-09 NOTE — Progress Notes (Signed)
   PRENATAL VISIT NOTE  Subjective:  Katrina Fuller is a 21 y.o. G2P1001 at [redacted]w[redacted]d being seen today for ongoing prenatal care.  She is currently monitored for the following issues for this low-risk pregnancy and has Supervision of normal pregnancy; Rh negative state in antepartum period; Family history of breast cancer; Fourth degree laceration of perineum during delivery, postpartum; Nonintractable episodic headache; Postpartum episiotomy dehiscence; History of gestational hypertension; History of diet controlled gestational diabetes mellitus (GDM); and Anemia affecting pregnancy, antepartum on her problem list.  Patient reports onset of diarrhea yesterday with 4 episodes thus far. She reports some nausea in the morning as well but no emesis. She denies any sick contacts. Patient also complaining of left  leg/hip pain occasionanlly.  Contractions: Irregular. Vag. Bleeding: None.  Movement: Present. Denies leaking of fluid.   The following portions of the patient's history were reviewed and updated as appropriate: allergies, current medications, past family history, past medical history, past social history, past surgical history and problem list. Problem list updated.  Objective:   Vitals:   09/09/17 0852  BP: (!) 89/59  Pulse: 73  Weight: 178 lb (80.7 kg)    Fetal Status: Fetal Heart Rate (bpm): 152 Fundal Height: 33 cm Movement: Present     General:  Alert, oriented and cooperative. Patient is in no acute distress.  Skin: Skin is warm and dry. No rash noted.   Cardiovascular: Normal heart rate noted  Respiratory: Normal respiratory effort, no problems with respiration noted  Abdomen: Soft, gravid, appropriate for gestational age.  Pain/Pressure: Present     Pelvic: Cervical exam deferred        Extremities: Normal range of motion.  Edema: None  Mental Status:  Normal mood and affect. Normal behavior. Normal judgment and thought content.   Assessment and Plan:  Pregnancy: G2P1001 at  [redacted]w[redacted]d  1. Encounter for supervision of other normal pregnancy in third trimester Patient is doing well without complaints Reviewed glucola results with the patient Rx maternity support belt provided for leg and hip discomfort  2. Fourth degree laceration of perineum during delivery, postpartum Patient desires primary cesarean section which will be scheduled at 39 weeks  Preterm labor symptoms and general obstetric precautions including but not limited to vaginal bleeding, contractions, leaking of fluid and fetal movement were reviewed in detail with the patient. Please refer to After Visit Summary for other counseling recommendations.  Return in about 3 weeks (around 09/30/2017) for ROB.   Catalina Antigua, MD

## 2017-09-09 NOTE — Progress Notes (Signed)
Last three BP readings on baby scripts 101/66, 100/69, 108/73. Pt states she is having nausea and diarrhea.

## 2017-09-30 ENCOUNTER — Other Ambulatory Visit (HOSPITAL_COMMUNITY)
Admission: RE | Admit: 2017-09-30 | Discharge: 2017-09-30 | Disposition: A | Discharge status: Home / Self Care | Payer: Medicaid Other | Source: Ambulatory Visit | Attending: Obstetrics & Gynecology | Admitting: Obstetrics & Gynecology

## 2017-09-30 ENCOUNTER — Ambulatory Visit (INDEPENDENT_AMBULATORY_CARE_PROVIDER_SITE_OTHER): Payer: Medicaid Other | Admitting: Obstetrics & Gynecology

## 2017-09-30 ENCOUNTER — Encounter: Payer: Self-pay | Admitting: Obstetrics & Gynecology

## 2017-09-30 VITALS — BP 110/76 | HR 81 | Wt 172.0 lb

## 2017-09-30 DIAGNOSIS — Z3480 Encounter for supervision of other normal pregnancy, unspecified trimester: Secondary | ICD-10-CM | POA: Insufficient documentation

## 2017-09-30 DIAGNOSIS — Z3A00 Weeks of gestation of pregnancy not specified: Secondary | ICD-10-CM | POA: Diagnosis not present

## 2017-09-30 DIAGNOSIS — Z3483 Encounter for supervision of other normal pregnancy, third trimester: Secondary | ICD-10-CM

## 2017-09-30 MED ORDER — SULFAMETHOXAZOLE-TRIMETHOPRIM 800-160 MG PO TABS: 1.0000 | ORAL_TABLET | Freq: Two times a day (BID) | ORAL | 0 refills | Status: DC

## 2017-09-30 NOTE — Progress Notes (Signed)
Pt recently had stomach bug and was vomiting

## 2017-09-30 NOTE — Addendum Note (Signed)
Addended by: Nicholaus BloomVE, Estoria Geary C on: 09/30/2017 10:09 AM   Modules accepted: Orders

## 2017-09-30 NOTE — Progress Notes (Signed)
   PRENATAL VISIT NOTE  Subjective:  Katrina Fuller is a 21 y.o. G2P1001 at 7621w5d being seen today for ongoing prenatal care.  She is currently monitored for the following issues for this low-risk pregnancy and has Supervision of normal pregnancy; Rh negative state in antepartum period; Family history of breast cancer; Fourth degree laceration of perineum during delivery, postpartum; Nonintractable episodic headache; Postpartum episiotomy dehiscence; History of gestational hypertension; History of diet controlled gestational diabetes mellitus (GDM); and Anemia affecting pregnancy, antepartum on her problem list.  Patient reports no complaints.  Contractions: Irregular. Vag. Bleeding: None.  Movement: Present. Denies leaking of fluid.   The following portions of the patient's history were reviewed and updated as appropriate: allergies, current medications, past family history, past medical history, past social history, past surgical history and problem list. Problem list updated.  Objective:   Vitals:   09/30/17 0954  BP: 110/76  Pulse: 81  Weight: 172 lb (78 kg)    Fetal Status: Fetal Heart Rate (bpm): 136   Movement: Present     General:  Alert, oriented and cooperative. Patient is in no acute distress.  Skin: Skin is warm and dry. No rash noted.   Cardiovascular: Normal heart rate noted  Respiratory: Normal respiratory effort, no problems with respiration noted  Abdomen: Soft, gravid, appropriate for gestational age.  Pain/Pressure: Present     Pelvic: Cervical exam performed        Extremities: Normal range of motion.  Edema: None  Mental Status:  Normal mood and affect. Normal behavior. Normal judgment and thought content.   Assessment and Plan:  Pregnancy: G2P1001 at 421w5d  1. Supervision of other normal pregnancy, antepartum  - GC/Chlamydia probe amp ([place])not at [facility] - Culture, beta strep (group b only) - PLTCS scheduled for [redacted] weeks EGA  Preterm labor symptoms  and general obstetric precautions including but not limited to vaginal bleeding, contractions, leaking of fluid and fetal movement were reviewed in detail with the patient. Please refer to After Visit Summary for other counseling recommendations.  Return in about 1 week (around 10/07/2017).   Allie BossierMyra C Rondy Krupinski, MD

## 2017-10-01 LAB — GC/CHLAMYDIA PROBE AMP (~~LOC~~) NOT AT ARMC
CHLAMYDIA, DNA PROBE: NEGATIVE
Neisseria Gonorrhea: NEGATIVE

## 2017-10-03 LAB — CULTURE, BETA STREP (GROUP B ONLY)
MICRO NUMBER:: 81221438
SPECIMEN QUALITY: ADEQUATE

## 2017-10-04 ENCOUNTER — Encounter: Payer: Self-pay | Admitting: Obstetrics & Gynecology

## 2017-10-05 ENCOUNTER — Encounter: Payer: Self-pay | Admitting: Obstetrics & Gynecology

## 2017-10-05 ENCOUNTER — Telehealth: Payer: Self-pay | Admitting: *Deleted

## 2017-10-05 NOTE — Telephone Encounter (Signed)
Pt called office stating that she was given Bactrim last week by Dr Marice Potterove for an infection on her leg.  When she went to the pharmacy the pharmacist would not fill it because she said it was not safe in pregnancy.  I spoke with Dr Penne LashLeggett who stated that Bactrim is the drug of choice for MRSA and that the benefits certainly out weight the risk.  I left a message on pt's cell phone giving her that info and encouraged her to call if she had any further questions.

## 2017-10-06 ENCOUNTER — Other Ambulatory Visit: Payer: Self-pay | Admitting: *Deleted

## 2017-10-06 MED ORDER — SULFAMETHOXAZOLE-TRIMETHOPRIM 800-160 MG PO TABS: 1.0000 | ORAL_TABLET | Freq: Two times a day (BID) | ORAL | 0 refills | Status: DC

## 2017-10-06 NOTE — Telephone Encounter (Signed)
RX for Septra DS resent to pharmacy because pharmacy cancelled the original order.

## 2017-10-07 ENCOUNTER — Ambulatory Visit (INDEPENDENT_AMBULATORY_CARE_PROVIDER_SITE_OTHER): Payer: Medicaid Other | Admitting: Obstetrics & Gynecology

## 2017-10-07 ENCOUNTER — Encounter: Payer: Self-pay | Admitting: Obstetrics & Gynecology

## 2017-10-07 DIAGNOSIS — L02419 Cutaneous abscess of limb, unspecified: Secondary | ICD-10-CM

## 2017-10-07 DIAGNOSIS — L03119 Cellulitis of unspecified part of limb: Secondary | ICD-10-CM

## 2017-10-07 NOTE — Progress Notes (Signed)
Pt has bump on leg that Dr.Dove is treating with Bactrim DS, she would like Katrina Fuller to look at it    PRENATAL VISIT NOTE  Subjective:  Katrina Fuller is a 21 y.o. G2P1001 at 2632w5d being seen today for ongoing prenatal care.  She is currently monitored for the following issues for this low-risk pregnancy and has Supervision of normal pregnancy; Rh negative state in antepartum period; Family history of breast cancer; Fourth degree laceration of perineum during delivery, postpartum; Nonintractable episodic headache; Postpartum episiotomy dehiscence; History of gestational hypertension; History of diet controlled gestational diabetes mellitus (GDM); Anemia affecting pregnancy, antepartum; and Cellulitis and abscess of leg on their problem list.  Patient reports leg abscess.  Contractions: Irregular. Vag. Bleeding: None.  Movement: Present. Denies leaking of fluid.   The following portions of the patient's history were reviewed and updated as appropriate: allergies, current medications, past family history, past medical history, past social history, past surgical history and problem list. Problem list updated.  Objective:   Vitals:   10/07/17 0952  BP: 119/84  Pulse: 94  Weight: 176 lb (79.8 kg)    Fetal Status: Fetal Heart Rate (bpm): 134 Fundal Height: 37 cm Movement: Present     General:  Alert, oriented and cooperative. Patient is in no acute distress.  Skin: Skin is warm and dry. No rash noted.   Cardiovascular: Normal heart rate noted  Respiratory: Normal respiratory effort, no problems with respiration noted  Abdomen: Soft, gravid, appropriate for gestational age.  Pain/Pressure: Present     Pelvic: Cervical exam deferred        Extremities: Normal range of motion.  Edema: None  Mental Status:  Normal mood and affect. Normal behavior. Normal judgment and thought content.   Assessment and Plan:  Pregnancy: G2P1001 at 6032w5d  1. Cellulitis and abscess of leg Decreasing in size.   Marked today.  Pt to call tomorrow nd let us kinow if it is improving.  Small area draining.  If not improving, pt to go to MAU for IV Vanc.  Term labor symptoms and general obstetric precautions including but not limited to vaginal bleeding, contractions, leaking of fluid and fetal movement were reviewed in detail with the patient. Please refer to After Visit Summary for other counseling recommendations.  No Follow-up on file.   Katrina LincolnKelly Arielys Wandersee, MD

## 2017-10-08 ENCOUNTER — Encounter: Payer: Self-pay | Admitting: Obstetrics & Gynecology

## 2017-10-08 ENCOUNTER — Encounter (HOSPITAL_COMMUNITY): Payer: Self-pay

## 2017-10-08 ENCOUNTER — Telehealth (HOSPITAL_COMMUNITY): Payer: Self-pay | Admitting: *Deleted

## 2017-10-08 NOTE — Telephone Encounter (Signed)
Preadmission screen  

## 2017-10-09 ENCOUNTER — Telehealth: Payer: Self-pay | Admitting: *Deleted

## 2017-10-09 ENCOUNTER — Encounter: Payer: Self-pay | Admitting: *Deleted

## 2017-10-09 NOTE — Telephone Encounter (Signed)
Pt emailed me to let me know that her leg is looking much better.  The hard area has not spread and there is little to no swelling now.  She will continue antibiotics and f/u PRN.  Dr Penne LashLeggett aware.

## 2017-10-12 ENCOUNTER — Ambulatory Visit (INDEPENDENT_AMBULATORY_CARE_PROVIDER_SITE_OTHER): Payer: Medicaid Other | Admitting: Obstetrics & Gynecology

## 2017-10-12 ENCOUNTER — Encounter: Payer: Self-pay | Admitting: *Deleted

## 2017-10-12 VITALS — BP 112/69 | HR 82 | Temp 97.3°F | Wt 176.0 lb

## 2017-10-12 DIAGNOSIS — Z6791 Unspecified blood type, Rh negative: Secondary | ICD-10-CM | POA: Diagnosis not present

## 2017-10-12 DIAGNOSIS — O09893 Supervision of other high risk pregnancies, third trimester: Secondary | ICD-10-CM | POA: Diagnosis not present

## 2017-10-12 DIAGNOSIS — O26899 Other specified pregnancy related conditions, unspecified trimester: Principal | ICD-10-CM

## 2017-10-12 DIAGNOSIS — Z3483 Encounter for supervision of other normal pregnancy, third trimester: Secondary | ICD-10-CM

## 2017-10-12 MED ORDER — RHO D IMMUNE GLOBULIN 1500 UNIT/2ML IJ SOSY
300.0000 ug | PREFILLED_SYRINGE | Freq: Once | INTRAMUSCULAR | Status: AC
  Administered 2017-10-12: 300 ug via INTRAMUSCULAR

## 2017-10-12 NOTE — Progress Notes (Signed)
Ketones-small    PRENATAL VISIT NOTE  Subjective:  Katrina Fuller is a 21 y.o. G2P1001 at 3055w3d being seen today for ongoing prenatal care.  She is currently monitored for the following issues for this high-risk pregnancy and has Supervision of normal pregnancy; Rh negative state in antepartum period; Family history of breast cancer; Fourth degree laceration of perineum during delivery, postpartum; Nonintractable episodic headache; Postpartum episiotomy dehiscence; History of gestational hypertension; History of diet controlled gestational diabetes mellitus (GDM); Anemia affecting pregnancy, antepartum; and Cellulitis and abscess of leg on their problem list.  Patient reports N/V/D today; .  Contractions: Irritability. Vag. Bleeding: None.  Movement: Present. Denies leaking of fluid.   The following portions of the patient's history were reviewed and updated as appropriate: allergies, current medications, past family history, past medical history, past social history, past surgical history and problem list. Problem list updated.  Objective:   Vitals:   10/12/17 1414  BP: 112/69  Pulse: 82  Temp: (!) 97.3 F (36.3 C)  Weight: 176 lb (79.8 kg)    Fetal Status: Fetal Heart Rate (bpm): 140 Fundal Height: 37 cm Movement: Present     General:  Alert, oriented and cooperative. Patient is in no acute distress.  Skin: Skin is warm and dry. No rash noted.   Cardiovascular: Normal heart rate noted  Respiratory: Normal respiratory effort, no problems with respiration noted  Abdomen: Soft, gravid, appropriate for gestational age.  Pain/Pressure: Present     Pelvic: Cervical exam performed        Extremities: Normal range of motion.  Edema: None  Mental Status:  Normal mood and affect. Normal behavior. Normal judgment and thought content.   Assessment and Plan:  Pregnancy: G2P1001 at 5355w3d  1. Rh negative state in antepartum period -Did not receive rhogam at 28 weeks.  Given today. -  Antibody screen; Future - Antibody screen  2. Cellulitis of right leg - Leg 80 percent better; finish Abx.  3.  <12 hours M/V/D -likely viral gastroenteritis.  Maintain fluids.  No evidence of pyleo or labor/chorio. - Come to ED with intractable N/V/D, high fever, dizziness.   Term labor symptoms and general obstetric precautions including but not limited to vaginal bleeding, contractions, leaking of fluid and fetal movement were reviewed in detail with the patient. Please refer to After Visit Summary for other counseling recommendations.  Return in about 1 week (around 10/19/2017).   Elsie LincolnKelly Rushawn Capshaw, MD

## 2017-10-13 LAB — ANTIBODY SCREEN: Antibody Screen: NOT DETECTED

## 2017-10-14 ENCOUNTER — Encounter: Payer: Self-pay | Admitting: Obstetrics & Gynecology

## 2017-10-14 ENCOUNTER — Encounter: Payer: Medicaid Other | Admitting: Obstetrics & Gynecology

## 2017-10-15 ENCOUNTER — Telehealth: Payer: Self-pay | Admitting: *Deleted

## 2017-10-15 MED ORDER — CYCLOBENZAPRINE HCL 10 MG PO TABS: 10.0000 mg | ORAL_TABLET | Freq: Three times a day (TID) | ORAL | 0 refills | Status: DC | PRN

## 2017-10-15 NOTE — Telephone Encounter (Signed)
-----   Message from Lesly DukesKelly H Leggett, MD sent at 10/15/2017 12:10 PM EST ----- Can you see if patient is taking tylenol for back pain?  Ask her if she has had any fevers?    Is using a heating pad?  Flexerill?  Pregnancy belt?

## 2017-10-15 NOTE — Telephone Encounter (Signed)
LM on voicemail to call office about her back pain.

## 2017-10-15 NOTE — Telephone Encounter (Signed)
Per VO Dr Penne LashLeggett pt may have Flexeril 10 mg every 6 hrs PRN and Benadryl to help with sleep.  If not improved she may give her Ambien.  Flexeril sent to [facility].

## 2017-10-19 ENCOUNTER — Encounter: Payer: Self-pay | Admitting: Obstetrics & Gynecology

## 2017-10-19 ENCOUNTER — Ambulatory Visit (INDEPENDENT_AMBULATORY_CARE_PROVIDER_SITE_OTHER): Payer: Medicaid Other | Admitting: Advanced Practice Midwife

## 2017-10-19 ENCOUNTER — Encounter: Payer: Self-pay | Admitting: Advanced Practice Midwife

## 2017-10-19 DIAGNOSIS — Z3483 Encounter for supervision of other normal pregnancy, third trimester: Secondary | ICD-10-CM

## 2017-10-19 NOTE — Progress Notes (Signed)
   PRENATAL VISIT NOTE  Subjective:  Katrina Fuller is a 21 y.o. G2P1001 at 4028w3d being seen today for ongoing prenatal care.  She is currently monitored for the following issues for this high-risk pregnancy and has Supervision of normal pregnancy; Rh negative state in antepartum period; Family history of breast cancer; Fourth degree laceration of perineum during delivery, postpartum; Nonintractable episodic headache; Postpartum episiotomy dehiscence; History of gestational hypertension; History of diet controlled gestational diabetes mellitus (GDM); Anemia affecting pregnancy, antepartum; and Cellulitis and abscess of leg on their problem list.  Patient reports occasional contractions.  Contractions: Irritability. Vag. Bleeding: None.  Movement: Present. Denies leaking of fluid.   The following portions of the patient's history were reviewed and updated as appropriate: allergies, current medications, past family history, past medical history, past social history, past surgical history and problem list. Problem list updated.  Objective:   Vitals:   10/19/17 0820  BP: 109/78  Pulse: 73  Weight: 174 lb (78.9 kg)    Fetal Status: Fetal Heart Rate (bpm): 147   Movement: Present     General:  Alert, oriented and cooperative. Patient is in no acute distress.  Skin: Skin is warm and dry. No rash noted.   Cardiovascular: Normal heart rate noted  Respiratory: Normal respiratory effort, no problems with respiration noted  Abdomen: Soft, gravid, appropriate for gestational age.  Pain/Pressure: Present     Pelvic: Cervical exam performed        Extremities: Normal range of motion.  Edema: None  Mental Status:  Normal mood and affect. Normal behavior. Normal judgment and thought content.   Assessment and Plan:  Pregnancy: G2P1001 at 3828w3d  1. Fourth degree laceration of perineum during delivery, postpartum      Scheduled for C/S with Dr Jolayne Pantheronstant on Friday      Reviewed course of care for  that  2. Encounter for supervision of other normal pregnancy in third trimester   Term labor symptoms and general obstetric precautions including but not limited to vaginal bleeding, contractions, leaking of fluid and fetal movement were reviewed in detail with the patient. Please refer to After Visit Summary for other counseling recommendations.  Return in about 3 weeks (around 11/09/2017) [place] [facility].   Wynelle BourgeoisMarie Evie Crumpler, CNM

## 2017-10-19 NOTE — Patient Instructions (Signed)

## 2017-10-21 ENCOUNTER — Encounter (HOSPITAL_COMMUNITY)
Admission: RE | Admit: 2017-10-21 | Discharge: 2017-10-21 | Disposition: A | Discharge status: Home / Self Care | Payer: Medicaid Other | Source: Ambulatory Visit | Attending: Obstetrics and Gynecology | Admitting: Obstetrics and Gynecology

## 2017-10-21 LAB — CBC
HCT: 34.8 % — ABNORMAL LOW (ref 36.0–46.0)
Hemoglobin: 10.7 g/dL — ABNORMAL LOW (ref 12.0–15.0)
MCH: 22 pg — ABNORMAL LOW (ref 26.0–34.0)
MCHC: 30.7 g/dL (ref 30.0–36.0)
MCV: 71.6 fL — ABNORMAL LOW (ref 78.0–100.0)
PLATELETS: 368 10*3/uL (ref 150–400)
RBC: 4.86 MIL/uL (ref 3.87–5.11)
RDW: 19.2 % — AB (ref 11.5–15.5)
WBC: 8.5 10*3/uL (ref 4.0–10.5)

## 2017-10-21 NOTE — Patient Instructions (Signed)
Katrina Fuller  10/21/2017   Your procedure is scheduled on:  10/23/2017  Enter through the Main Entrance of [facility] at 0930 AM.  Pick up the phone at the desk and dial [phone]  Call this number if you have problems the morning of surgery:[phone]  Remember:   Do not eat food:After Midnight.  Do not drink clear liquids: After Midnight.  Take these medicines the morning of surgery with A SIP OF WATER: none   Do not wear jewelry, make-up or nail polish.  Do not wear lotions, powders, or perfumes. Do not wear deodorant.  Do not shave 48 hours prior to surgery.  Do not bring valuables to the hospital.  [facility] is not   responsible for any belongings or valuables brought to the hospital.  Contacts, dentures or bridgework may not be worn into surgery.  Leave suitcase in the car. After surgery it may be brought to your room.  For patients admitted to the hospital, checkout time is 11:00 AM the day of              discharge.    N/A   Please read over the following fact sheets that you were given:   Surgical Site Infection Prevention

## 2017-10-22 LAB — RPR: RPR Ser Ql: NONREACTIVE

## 2017-10-23 ENCOUNTER — Inpatient Hospital Stay (HOSPITAL_COMMUNITY)
Admission: AD | Admit: 2017-10-23 | Discharge: 2017-10-25 | DRG: 788 | Disposition: A | Discharge status: Home / Self Care | Payer: Medicaid Other | Source: Ambulatory Visit | Attending: Obstetrics and Gynecology | Admitting: Obstetrics and Gynecology

## 2017-10-23 ENCOUNTER — Inpatient Hospital Stay (HOSPITAL_COMMUNITY): Payer: Medicaid Other | Admitting: Certified Registered Nurse Anesthetist

## 2017-10-23 ENCOUNTER — Encounter (HOSPITAL_COMMUNITY)
Admission: AD | Disposition: A | Discharge status: Home / Self Care | Payer: Self-pay | Source: Ambulatory Visit | Attending: Obstetrics and Gynecology

## 2017-10-23 ENCOUNTER — Encounter (HOSPITAL_COMMUNITY): Payer: Self-pay | Admitting: Obstetrics

## 2017-10-23 DIAGNOSIS — Z3A39 39 weeks gestation of pregnancy: Secondary | ICD-10-CM

## 2017-10-23 DIAGNOSIS — O26893 Other specified pregnancy related conditions, third trimester: Secondary | ICD-10-CM | POA: Diagnosis present

## 2017-10-23 DIAGNOSIS — O9902 Anemia complicating childbirth: Secondary | ICD-10-CM | POA: Diagnosis present

## 2017-10-23 DIAGNOSIS — Z6791 Unspecified blood type, Rh negative: Secondary | ICD-10-CM

## 2017-10-23 DIAGNOSIS — D649 Anemia, unspecified: Secondary | ICD-10-CM | POA: Diagnosis present

## 2017-10-23 DIAGNOSIS — Z87891 Personal history of nicotine dependence: Secondary | ICD-10-CM

## 2017-10-23 DIAGNOSIS — O34211 Maternal care for low transverse scar from previous cesarean delivery: Secondary | ICD-10-CM | POA: Diagnosis not present

## 2017-10-23 DIAGNOSIS — Z98891 History of uterine scar from previous surgery: Secondary | ICD-10-CM

## 2017-10-23 SURGERY — Surgical Case
Anesthesia: Spinal | Site: Abdomen | Wound class: Clean Contaminated

## 2017-10-23 MED ORDER — PHENYLEPHRINE 8 MG IN D5W 100 ML (0.08MG/ML) PREMIX OPTIME
INJECTION | INTRAVENOUS | Status: AC
  Filled 2017-10-23: qty 100

## 2017-10-23 MED ORDER — DIPHENHYDRAMINE HCL 50 MG/ML IJ SOLN: 12.5000 mg | INTRAMUSCULAR | Status: DC | PRN

## 2017-10-23 MED ORDER — SIMETHICONE 80 MG PO CHEW
80.0000 mg | CHEWABLE_TABLET | ORAL | Status: DC | PRN
  Filled 2017-10-23: qty 1

## 2017-10-23 MED ORDER — SCOPOLAMINE 1 MG/3DAYS TD PT72
1.0000 | MEDICATED_PATCH | Freq: Once | TRANSDERMAL | Status: DC
  Administered 2017-10-23: 1.5 mg via TRANSDERMAL

## 2017-10-23 MED ORDER — KETOROLAC TROMETHAMINE 30 MG/ML IJ SOLN: 30.0000 mg | Freq: Once | INTRAMUSCULAR | Status: DC | PRN

## 2017-10-23 MED ORDER — MORPHINE SULFATE (PF) 0.5 MG/ML IJ SOLN
INTRAMUSCULAR | Status: DC | PRN
  Administered 2017-10-23: .2 mg via EPIDURAL

## 2017-10-23 MED ORDER — SENNOSIDES-DOCUSATE SODIUM 8.6-50 MG PO TABS
2.0000 | ORAL_TABLET | ORAL | Status: DC
  Administered 2017-10-23 – 2017-10-25 (×2): 2 via ORAL
  Filled 2017-10-23 (×4): qty 2

## 2017-10-23 MED ORDER — LACTATED RINGERS IV SOLN
INTRAVENOUS | Status: DC
  Administered 2017-10-23: 1000 mL via INTRAVENOUS
  Administered 2017-10-24: 01:00:00 via INTRAVENOUS

## 2017-10-23 MED ORDER — SODIUM CHLORIDE 0.9 % IR SOLN
Status: DC | PRN
  Administered 2017-10-23: 1

## 2017-10-23 MED ORDER — LACTATED RINGERS IV SOLN
INTRAVENOUS | Status: DC | PRN
  Administered 2017-10-23 (×3): via INTRAVENOUS

## 2017-10-23 MED ORDER — DIPHENHYDRAMINE HCL 25 MG PO CAPS
25.0000 mg | ORAL_CAPSULE | ORAL | Status: DC | PRN
  Filled 2017-10-23: qty 1

## 2017-10-23 MED ORDER — SODIUM CHLORIDE 0.9% FLUSH: 3.0000 mL | INTRAVENOUS | Status: DC | PRN

## 2017-10-23 MED ORDER — ONDANSETRON HCL 4 MG/2ML IJ SOLN
INTRAMUSCULAR | Status: AC
  Filled 2017-10-23: qty 2

## 2017-10-23 MED ORDER — ONDANSETRON HCL 4 MG/2ML IJ SOLN: 4.0000 mg | Freq: Three times a day (TID) | INTRAMUSCULAR | Status: DC | PRN

## 2017-10-23 MED ORDER — NALBUPHINE HCL 10 MG/ML IJ SOLN: 5.0000 mg | INTRAMUSCULAR | Status: DC | PRN

## 2017-10-23 MED ORDER — NALBUPHINE HCL 10 MG/ML IJ SOLN
INTRAMUSCULAR | Status: AC
  Filled 2017-10-23: qty 1

## 2017-10-23 MED ORDER — OXYCODONE HCL 5 MG PO TABS
5.0000 mg | ORAL_TABLET | ORAL | Status: DC | PRN
  Administered 2017-10-24 (×2): 5 mg via ORAL
  Filled 2017-10-23 (×2): qty 1

## 2017-10-23 MED ORDER — DIBUCAINE 1 % RE OINT
1.0000 "application " | TOPICAL_OINTMENT | RECTAL | Status: DC | PRN
  Filled 2017-10-23: qty 28

## 2017-10-23 MED ORDER — WITCH HAZEL-GLYCERIN EX PADS: 1.0000 "application " | MEDICATED_PAD | CUTANEOUS | Status: DC | PRN

## 2017-10-23 MED ORDER — NALOXONE HCL 0.4 MG/ML IJ SOLN: 1.0000 ug/kg/h | INTRAVENOUS | Status: DC | PRN

## 2017-10-23 MED ORDER — ACETAMINOPHEN 325 MG PO TABS: 650.0000 mg | ORAL_TABLET | ORAL | Status: DC | PRN

## 2017-10-23 MED ORDER — BUPIVACAINE IN DEXTROSE 0.75-8.25 % IT SOLN
INTRATHECAL | Status: AC
  Filled 2017-10-23: qty 2

## 2017-10-23 MED ORDER — NALBUPHINE HCL 10 MG/ML IJ SOLN: 5.0000 mg | Freq: Once | INTRAMUSCULAR | Status: AC | PRN

## 2017-10-23 MED ORDER — DIPHENHYDRAMINE HCL 25 MG PO CAPS
25.0000 mg | ORAL_CAPSULE | Freq: Four times a day (QID) | ORAL | Status: DC | PRN
  Filled 2017-10-23: qty 1

## 2017-10-23 MED ORDER — SCOPOLAMINE 1 MG/3DAYS TD PT72
MEDICATED_PATCH | TRANSDERMAL | Status: AC
  Filled 2017-10-23: qty 1

## 2017-10-23 MED ORDER — ONDANSETRON HCL 4 MG/2ML IJ SOLN
INTRAMUSCULAR | Status: DC | PRN
  Administered 2017-10-23: 4 mg via INTRAVENOUS

## 2017-10-23 MED ORDER — MEPERIDINE HCL 25 MG/ML IJ SOLN: 6.2500 mg | INTRAMUSCULAR | Status: DC | PRN

## 2017-10-23 MED ORDER — MENTHOL 3 MG MT LOZG
1.0000 | LOZENGE | OROMUCOSAL | Status: DC | PRN
  Filled 2017-10-23: qty 9

## 2017-10-23 MED ORDER — OXYCODONE HCL 5 MG PO TABS: 10.0000 mg | ORAL_TABLET | ORAL | Status: DC | PRN

## 2017-10-23 MED ORDER — COCONUT OIL OIL: 1.0000 "application " | TOPICAL_OIL | Status: DC | PRN

## 2017-10-23 MED ORDER — BUPIVACAINE IN DEXTROSE 0.75-8.25 % IT SOLN
INTRATHECAL | Status: DC | PRN
  Administered 2017-10-23: 1.6 mg via INTRATHECAL

## 2017-10-23 MED ORDER — MORPHINE SULFATE (PF) 0.5 MG/ML IJ SOLN
INTRAMUSCULAR | Status: AC
  Filled 2017-10-23: qty 10

## 2017-10-23 MED ORDER — OXYTOCIN 10 UNIT/ML IJ SOLN
INTRAMUSCULAR | Status: AC
  Filled 2017-10-23: qty 4

## 2017-10-23 MED ORDER — SOD CITRATE-CITRIC ACID 500-334 MG/5ML PO SOLN
ORAL | Status: AC
Start: 2017-10-23 — End: 2017-10-23
  Administered 2017-10-23: 30 mL
  Filled 2017-10-23: qty 15

## 2017-10-23 MED ORDER — CEFAZOLIN SODIUM-DEXTROSE 2-4 GM/100ML-% IV SOLN
2.0000 g | INTRAVENOUS | Status: AC
  Administered 2017-10-23: 2 g via INTRAVENOUS

## 2017-10-23 MED ORDER — PROMETHAZINE HCL 25 MG/ML IJ SOLN: 6.2500 mg | INTRAMUSCULAR | Status: DC | PRN

## 2017-10-23 MED ORDER — NALBUPHINE HCL 10 MG/ML IJ SOLN
5.0000 mg | Freq: Once | INTRAMUSCULAR | Status: AC | PRN
  Administered 2017-10-23: 5 mg via INTRAVENOUS

## 2017-10-23 MED ORDER — HYDROMORPHONE HCL 1 MG/ML IJ SOLN
0.2500 mg | INTRAMUSCULAR | Status: DC | PRN
Start: 2017-10-23 — End: 2017-10-23

## 2017-10-23 MED ORDER — SCOPOLAMINE 1 MG/3DAYS TD PT72: 1.0000 | MEDICATED_PATCH | Freq: Once | TRANSDERMAL | Status: DC

## 2017-10-23 MED ORDER — SIMETHICONE 80 MG PO CHEW
80.0000 mg | CHEWABLE_TABLET | Freq: Three times a day (TID) | ORAL | Status: DC
  Administered 2017-10-24 – 2017-10-25 (×4): 80 mg via ORAL
  Filled 2017-10-23 (×9): qty 1

## 2017-10-23 MED ORDER — IBUPROFEN 600 MG PO TABS
600.0000 mg | ORAL_TABLET | Freq: Four times a day (QID) | ORAL | Status: DC
  Administered 2017-10-23 – 2017-10-25 (×7): 600 mg via ORAL
  Filled 2017-10-23 (×7): qty 1

## 2017-10-23 MED ORDER — PHENYLEPHRINE 8 MG IN D5W 100 ML (0.08MG/ML) PREMIX OPTIME
INJECTION | INTRAVENOUS | Status: DC | PRN
  Administered 2017-10-23: 60 ug/min via INTRAVENOUS

## 2017-10-23 MED ORDER — FENTANYL CITRATE (PF) 100 MCG/2ML IJ SOLN
INTRAMUSCULAR | Status: AC
  Filled 2017-10-23: qty 2

## 2017-10-23 MED ORDER — OXYTOCIN 10 UNIT/ML IJ SOLN
INTRAVENOUS | Status: DC | PRN
  Administered 2017-10-23: 40 [IU] via INTRAVENOUS

## 2017-10-23 MED ORDER — PRENATAL MULTIVITAMIN CH
1.0000 | ORAL_TABLET | Freq: Every day | ORAL | Status: DC
  Administered 2017-10-24 – 2017-10-25 (×2): 1 via ORAL
  Filled 2017-10-23 (×4): qty 1

## 2017-10-23 MED ORDER — SIMETHICONE 80 MG PO CHEW
80.0000 mg | CHEWABLE_TABLET | ORAL | Status: DC
  Administered 2017-10-23 – 2017-10-25 (×2): 80 mg via ORAL
  Filled 2017-10-23 (×4): qty 1

## 2017-10-23 MED ORDER — OXYTOCIN 40 UNITS IN LACTATED RINGERS INFUSION - SIMPLE MED: 2.5000 [IU]/h | INTRAVENOUS | Status: AC

## 2017-10-23 MED ORDER — TETANUS-DIPHTH-ACELL PERTUSSIS 5-2.5-18.5 LF-MCG/0.5 IM SUSP: 0.5000 mL | Freq: Once | INTRAMUSCULAR | Status: DC

## 2017-10-23 MED ORDER — SCOPOLAMINE 1 MG/3DAYS TD PT72
MEDICATED_PATCH | TRANSDERMAL | Status: AC
  Administered 2017-10-23: 1.5 mg via TRANSDERMAL
  Filled 2017-10-23: qty 1

## 2017-10-23 MED ORDER — NALOXONE HCL 0.4 MG/ML IJ SOLN: 0.4000 mg | INTRAMUSCULAR | Status: DC | PRN

## 2017-10-23 MED ORDER — KETOROLAC TROMETHAMINE 30 MG/ML IJ SOLN
INTRAMUSCULAR | Status: AC
  Administered 2017-10-23: 30 mg
  Filled 2017-10-23: qty 1

## 2017-10-23 MED ORDER — SOD CITRATE-CITRIC ACID 500-334 MG/5ML PO SOLN: 30.0000 mL | Freq: Once | ORAL | Status: DC

## 2017-10-23 SURGICAL SUPPLY — 29 items
BENZOIN TINCTURE PRP APPL 2/3 (GAUZE/BANDAGES/DRESSINGS) ×2 IMPLANT
CHLORAPREP W/TINT 26ML (MISCELLANEOUS) ×2 IMPLANT
CLAMP CORD UMBIL (MISCELLANEOUS) IMPLANT
CLOSURE STERI STRIP 1/2 X4 (GAUZE/BANDAGES/DRESSINGS) ×2 IMPLANT
CONTAINER PREFILL 10% NBF 15ML (MISCELLANEOUS) IMPLANT
DRSG OPSITE POSTOP 4X10 (GAUZE/BANDAGES/DRESSINGS) ×2 IMPLANT
ELECT REM PT RETURN 9FT ADLT (ELECTROSURGICAL) ×2
ELECTRODE REM PT RTRN 9FT ADLT (ELECTROSURGICAL) ×1 IMPLANT
EXTRACTOR VACUUM M CUP 4 TUBE (SUCTIONS) IMPLANT
GLOVE BIOGEL PI IND STRL 6.5 (GLOVE) ×1 IMPLANT
GLOVE BIOGEL PI IND STRL 7.0 (GLOVE) ×1 IMPLANT
GLOVE BIOGEL PI INDICATOR 6.5 (GLOVE) ×1
GLOVE BIOGEL PI INDICATOR 7.0 (GLOVE) ×1
GLOVE SURG SS PI 6.0 STRL IVOR (GLOVE) ×2 IMPLANT
GOWN STRL REUS W/TWL LRG LVL3 (GOWN DISPOSABLE) ×4 IMPLANT
KIT ABG SYR 3ML LUER SLIP (SYRINGE) IMPLANT
NEEDLE HYPO 25X5/8 SAFETYGLIDE (NEEDLE) IMPLANT
NS IRRIG 1000ML POUR BTL (IV SOLUTION) ×2 IMPLANT
PACK C SECTION WH (CUSTOM PROCEDURE TRAY) ×2 IMPLANT
PAD OB MATERNITY 4.3X12.25 (PERSONAL CARE ITEMS) ×2 IMPLANT
PENCIL SMOKE EVAC W/HOLSTER (ELECTROSURGICAL) ×2 IMPLANT
RTRCTR C-SECT PINK 25CM LRG (MISCELLANEOUS) IMPLANT
SEPRAFILM MEMBRANE 5X6 (MISCELLANEOUS) IMPLANT
SUT MON AB 4-0 PS1 27 (SUTURE) ×2 IMPLANT
SUT PLAIN 0 NONE (SUTURE) IMPLANT
SUT VIC AB 0 CT1 36 (SUTURE) ×10 IMPLANT
SUT VIC AB 4-0 KS 27 (SUTURE) ×2 IMPLANT
TOWEL OR 17X24 6PK STRL BLUE (TOWEL DISPOSABLE) ×2 IMPLANT
TRAY FOLEY BAG SILVER LF 14FR (SET/KITS/TRAYS/PACK) ×2 IMPLANT

## 2017-10-23 NOTE — Anesthesia Procedure Notes (Signed)
Spinal  Patient location during procedure: OR Staffing Anesthesiologist: Nolon Nations, MD Performed: anesthesiologist  Preanesthetic Checklist Completed: patient identified, site marked, surgical consent, pre-op evaluation, timeout performed, IV checked, risks and benefits discussed and monitors and equipment checked Spinal Block Patient position: sitting Prep: site prepped and draped and DuraPrep Patient monitoring: heart rate, continuous pulse ox and blood pressure Approach: midline Location: L2-3 Injection technique: single-shot Needle Needle type: Sprotte  Needle gauge: 24 G Needle length: 9 cm Additional Notes Expiration date of kit checked and confirmed. Patient tolerated procedure well, without complications.

## 2017-10-23 NOTE — H&P (Signed)
Katrina Fuller is a 21 y.o. female G2P1001 at 3730w0d presenting for scheduled primary cesarean section. [facility] 7 weeks complicated by a previous 4 th degree laceration with chronic vaginal pain and Rh negative status. Patient reports doing well and denies any vaginal bleeding, leakage of fluid or regular contractions. She reports good fetal movement  OB History    Gravida Para Term Preterm AB Living   2 1 1     1    SAB TAB Ectopic Multiple Live Births                 History reviewed. No pertinent past medical history. Past Surgical History:  Procedure Laterality Date  . chin surgery     5 surgeries for cyst on chin   Family History: family history includes Breast cancer in her paternal aunt, paternal grandmother, and sister; Diabetes in her father and mother; Diverticulitis in her father and mother; Hypertension in her father and mother; Multiple sclerosis in her father. Social History:  reports that she has quit smoking. Her smoking use included cigarettes. she has never used smokeless tobacco. She reports that she does not drink alcohol or use drugs.     Maternal Diabetes: No Genetic Screening: Declined Maternal Ultrasounds/Referrals: Normal Fetal Ultrasounds or other Referrals:  None Maternal Substance Abuse:  No Significant Maternal Medications:  None Significant Maternal Lab Results:  None Other Comments:  None  ROS  See pertinent in HPI History   Blood pressure 117/81, pulse 93, temperature 97.9 F (36.6 C), temperature source Oral, height 5' (1.524 m), weight 174 lb (78.9 kg), last menstrual period 12/15/2016. Exam Physical Exam  GENERAL: Well-developed, well-nourished female in no acute distress.  LUNGS: Clear to auscultation bilaterally.  HEART: Regular rate and rhythm. ABDOMEN: Soft, nontender, gravid PELVIC: Not indicated. EXTREMITIES: No cyanosis, clubbing, or edema, 2+ distal pulses.  Prenatal labs: ABO, Rh: --/--/O NEG  (11/21 1005) Antibody: POS (11/21 1005) Rubella: 2.62 (04/18 1030) RPR: Non Reactive (11/21 1005)  HBsAg: NEGATIVE (04/18 1030)  HIV: NON-REACTIVE (09/12 1003)  GBS:   negative  Assessment/Plan: 21 yo G2P1 at 39 weeks with a history of 4th degree laceration and chronic vaginal pain here for primary cesarean section  - Risks, benefits and alternatives were explained including but not limited to risks of bleeding, infection and damage to adjacent organs. Patient verbalized understanding - Patient plans to use natural rhythm method for contraception - Patient plans to breastfeed  Katrina Fuller 10/23/2017, 10:24 AM

## 2017-10-23 NOTE — Lactation Note (Signed)
This note was copied from a baby's chart. Lactation Consultation Note  Patient Name: Katrina Fuller OZHYQ'MToday's Date: 10/23/2017 Reason for consult: Initial assessment  Baby 3 hours old. Mom reports that this baby is latching much better than her 2 older children. Mom reports that she did some breast pumping prior to delivery, and believes she is seeing much more milk flow at this point with this baby than she did with the first 2. Discussed progression of milk coming to volume and supply and demand, and enc mom to offer lots of STS and nurse with cues. [facility], aware of OP/BFSG and LC phone line assistance after D/C.   Maternal Data Has patient been taught Hand Expression?: Yes(per mom. ) Does the patient have breastfeeding experience prior to this delivery?: Yes  Feeding Feeding Type: Breast Fed Length of feed: 60 min  LATCH Score Latch: Repeated attempts needed to sustain latch, nipple held in mouth throughout feeding, stimulation needed to elicit sucking reflex.  Audible Swallowing: A few with stimulation  Type of Nipple: Everted at rest and after stimulation  Comfort (Breast/Nipple): Soft / non-tender  Hold (Positioning): Assistance needed to correctly position infant at breast and maintain latch.  LATCH Score: 7  Interventions Interventions: Breast feeding basics reviewed  Lactation Tools Discussed/Used     Consult Status Consult Status: Follow-up Date: 10/24/17 Follow-up type: In-patient    Sherlyn HayJennifer D Letia Guidry 10/23/2017, 4:10 PM

## 2017-10-23 NOTE — Op Note (Signed)
Katrina Fuller PROCEDURE DATE: 10/23/2017  PREOPERATIVE DIAGNOSIS: Intrauterine pregnancy at  2249w0d weeks gestation; patient declines vag del attempt due to previous 4th degree laceration  POSTOPERATIVE DIAGNOSIS: The same  PROCEDURE:     Cesarean Section  SURGEON:  Dr. Catalina AntiguaPeggy Jordain Radin  ASSISTANT: Dr. Rachelle HoraMoss  INDICATIONS: Katrina Fuller is a 21 y.o. G2P1001 at 6849w0d scheduled for cesarean section secondary to patient declines vag del attempt due to previous 4th degree laceration.  The risks of cesarean section discussed with the patient included but were not limited to: bleeding which may require transfusion or reoperation; infection which may require antibiotics; injury to bowel, bladder, ureters or other surrounding organs; injury to the fetus; need for additional procedures including hysterectomy in the event of a life-threatening hemorrhage; placental abnormalities wth subsequent pregnancies, incisional problems, thromboembolic phenomenon and other postoperative/anesthesia complications. The patient concurred with the proposed plan, giving informed written consent for the procedure.    FINDINGS:  Viable female infant in cephalic presentation.  Apgars 8 and 9.  Clear amniotic fluid.  Intact placenta, three vessel cord.  Normal uterus, fallopian tubes and ovaries bilaterally.  ANESTHESIA:    Spinal INTRAVENOUS FLUIDS:2300 ml ESTIMATED BLOOD LOSS: 840 mL ml URINE OUTPUT:  250 ml SPECIMENS: Placenta sent to L&D COMPLICATIONS: None immediate  PROCEDURE IN DETAIL:  The patient received intravenous antibiotics and had sequential compression devices applied to her lower extremities while in the preoperative area.  She was then taken to the operating room where anesthesia was induced and was found to be adequate. A foley catheter was placed into her bladder and attached to Katrina Fuller gravity. She was then placed in a dorsal supine position with a leftward tilt, and prepped and draped in a sterile  manner. After an adequate timeout was performed, a Pfannenstiel skin incision was made with scalpel and carried through to the underlying layer of fascia. The fascia was incised in the midline and this incision was extended bilaterally using the Mayo scissors. Kocher clamps were applied to the superior aspect of the fascial incision and the underlying rectus muscles were dissected off bluntly. A similar process was carried out on the inferior aspect of the facial incision. The rectus muscles were separated in the midline bluntly and the peritoneum was entered bluntly. The Alexis self-retaining retractor was introduced into the abdominal cavity. Attention was turned to the lower uterine segment where a transverse hysterotomy was made with a scalpel and extended bilaterally bluntly. The infant was successfully delivered, and cord was clamped and cut and infant was handed over to awaiting neonatology team. Uterine massage was then administered and the placenta delivered intact with three-vessel cord. The uterus was cleared of clot and debris.  The hysterotomy was closed with 0 Vicryl in a running locked fashion, and an imbricating layer was also placed with a 0 Vicryl. Overall, excellent hemostasis was noted. The pelvis copiously irrigated and cleared of all clot and debris. Hemostasis was confirmed on all surfaces.  The peritoneum and the muscles were reapproximated using 0 vicryl interrupted stitches. The fascia was then closed using 0 Vicryl in a running fashion.  The skin was closed in a subcuticular fashion using 3.0 Vicryl. The patient tolerated the procedure well. Sponge, lap, instrument and needle counts were correct x 2. She was taken to the recovery room in stable condition.    Katrina Fuller ConstantMD  10/23/2017 12:57 PM

## 2017-10-23 NOTE — Transfer of Care (Signed)
Immediate Anesthesia Transfer of Care Note  Patient: Katrina Fuller  Procedure(s) Performed: PRIMARY CESAREAN SECTION (N/A Abdomen)  Patient Location: PACU  Anesthesia Type:Spinal  Level of Consciousness: awake  Airway & Oxygen Therapy: Patient Spontanous Breathing  Post-op Assessment: Report given to RN and Post -op Vital signs reviewed and stable  Post vital signs: stable  Last Vitals:  Vitals:   10/23/17 0955  BP: 117/81  Pulse: 93  Temp: 36.6 C    Last Pain:  Vitals:   10/23/17 0955  TempSrc: Oral         Complications: No apparent anesthesia complications

## 2017-10-23 NOTE — Anesthesia Preprocedure Evaluation (Signed)
Anesthesia Evaluation  Patient identified by MRN, date of birth, ID band Patient awake    Reviewed: Allergy & Precautions, NPO status , Patient's Chart, lab work & pertinent test results  Airway Mallampati: II  TM Distance: >3 FB Neck ROM: Full    Dental  (+) Poor Dentition, Chipped   Pulmonary former smoker,    Pulmonary exam normal breath sounds clear to auscultation       Cardiovascular negative cardio ROS Normal cardiovascular exam Rhythm:Regular Rate:Normal     Neuro/Psych  Headaches, negative psych ROS   GI/Hepatic negative GI ROS, Neg liver ROS,   Endo/Other  negative endocrine ROS  Renal/GU negative Renal ROS     Musculoskeletal negative musculoskeletal ROS (+)   Abdominal   Peds  Hematology  (+) anemia ,   Anesthesia Other Findings   Reproductive/Obstetrics (+) Pregnancy                             Anesthesia Physical Anesthesia Plan  ASA: II  Anesthesia Plan: Spinal   Post-op Pain Management:    Induction:   PONV Risk Score and Plan: 4 or greater and Ondansetron, Scopolamine patch - Pre-op and Treatment may vary due to age or medical condition  Airway Management Planned:   Additional Equipment:   Intra-op Plan:   Post-operative Plan:   Informed Consent: I have reviewed the patients History and Physical, chart, labs and discussed the procedure including the risks, benefits and alternatives for the proposed anesthesia with the patient or authorized representative who has indicated his/her understanding and acceptance.   Dental advisory given  Plan Discussed with: CRNA  Anesthesia Plan Comments:         Anesthesia Quick Evaluation

## 2017-10-24 LAB — CBC
HCT: 24.8 % — ABNORMAL LOW (ref 36.0–46.0)
HEMOGLOBIN: 7.8 g/dL — AB (ref 12.0–15.0)
MCH: 22.6 pg — ABNORMAL LOW (ref 26.0–34.0)
MCHC: 31.5 g/dL (ref 30.0–36.0)
MCV: 71.9 fL — ABNORMAL LOW (ref 78.0–100.0)
Platelets: 245 10*3/uL (ref 150–400)
RBC: 3.45 MIL/uL — ABNORMAL LOW (ref 3.87–5.11)
RDW: 19.2 % — AB (ref 11.5–15.5)
WBC: 9.8 10*3/uL (ref 4.0–10.5)

## 2017-10-24 MED ORDER — FERROUS SULFATE 325 (65 FE) MG PO TABS
325.0000 mg | ORAL_TABLET | Freq: Two times a day (BID) | ORAL | Status: DC
  Administered 2017-10-24: 325 mg via ORAL
  Filled 2017-10-24: qty 1

## 2017-10-24 MED ORDER — LACTATED RINGERS IV BOLUS (SEPSIS)
500.0000 mL | Freq: Once | INTRAVENOUS | Status: AC
  Administered 2017-10-24: 500 mL via INTRAVENOUS

## 2017-10-24 NOTE — Progress Notes (Signed)
POSTPARTUM PROGRESS NOTE  Post Operative Day #1 Subjective:  Katrina Fuller is a 21 y.o. G2P1001 6264w1d s/p SVD.  No acute events overnight.  Pt denies problems with ambulating, voiding or po intake.  She denies nausea or vomiting.  Pain is well controlled.  She has had flatus. She has not had bowel movement.  Lochia Minimal.   Objective: Blood pressure (!) 93/53, pulse 90, temperature 98 F (36.7 C), temperature source Oral, resp. rate 16, height 5' (1.524 m), weight 174 lb (78.9 kg), last menstrual period 12/15/2016, SpO2 98 %.  Physical Exam:  General: alert, cooperative and no distress Lochia:normal flow Chest: no respiratory distress Heart:regular rate, distal pulses intact Incision: Dry and clean, no leakage Abdomen: soft, nontender,  Uterine Fundus: firm, appropriately tender DVT Evaluation: No calf swelling or tenderness Extremities: mild edema  Recent Labs    10/21/17 1005 10/24/17 0537  HGB 10.7* 7.8*  HCT 34.8* 24.8*    Assessment/Plan:  ASSESSMENT: Katrina Fuller is a 21 y.o. G2P1001 7664w1d s/p pLTCS. Hemoglobin is 7.8 this morning. Started on Ferrous sulfate. Denies any symptoms of dizziness or SOB. Poor urine output. Bolus overnight, with improvement in UOP. Monitor BP.  Plan for discharge tomorrow   LOS: 1 day   Lovena NeighboursAbdoulaye Tashonna Descoteaux, MD 10/24/2017, 8:43 AM

## 2017-10-24 NOTE — Progress Notes (Signed)
CSW received consult due to score 15 on Edinburgh Depression Screen.    When CSW arrived, MOB was resting in bed, FOB was resting in the recliner, and MOB's mother was holding infant. CSW's explained CSW role, and MOB gave CSW permission to complete the assessment while MOB's guest were present.   CSW inquired about MOB's thoughts and feelings about being a new mother again and MOB expressed that MOB was happy.  MOB's happiness  was evident by MOB's expression while MOB spoke about her newborn and 14 month old.   CSW provided education regarding Baby Blues vs PMADs. CSW encouraged MOB to evaluate her mental health throughout the postpartum period with the use of the New Mom Checklist developed by Postpartum Progress and notify a medical professional if symptoms arise. MOB denied PPD signs and symptoms with MOB's oldest child.  MOB appeared to have insight and awareness and voice the MOB has a "huge" support team.    CSW identifies no further need for intervention and no barriers to discharge at this time.  Jasun Gasparini Boyd-Gilyard, MSW, LCSW Clinical Social Work [phone]  

## 2017-10-24 NOTE — Lactation Note (Signed)
This note was copied from a baby's chart. Lactation Consultation Note  Patient Name: Katrina Fuller: 10/24/2017 Reason for consult: Follow-up assessment;Term;Infant weight loss  Baby is 6727 hours old , and has been sluggish with feedings today  As LC entered the room baby lying in the crib sucking on a pacifier.  LC reviewed the potential challenges that can occur in the early stages  Of breast feeding when using a pacifier. Mom receptive.  Mom pumping left breast with 1 ml EBM yield and mentioned the other side  Of the pump doesn't work. LC fixed and strong suction noted. #24 F comfortable fit.  LC offered to assist with latching , attempted the left 1st, few sucks , and would not sustain  Latch. Switched to the right and latched , few sucks and off. LC tried a #24 NS after sizing and  Baby latched and sustained without suck.  LC recommended having grandma hold the baby while mom pumps both breast with the DEBP.  LC assisted with the DEBP .  Mom motivated for breast feeding and due to weight loss, and sluggish with feedings asked  When does the baby need to be supplemented. LC mentioned since she is pumping and expressing  Milk we are heading in the right direction. If the baby continues to be sluggish and EBM isn't available,  And she isn't latching formula may be indicted.  Noralee StainSharon HIce, RN ,IBCLC aware for F/U.    Maternal Data Has patient been taught Hand Expression?: Yes  Feeding Feeding Type: Breast Milk Length of feed: (no suckle )  LATCH Score Latch: Grasps breast easily, tongue down, lips flanged, rhythmical sucking.(baby latched and not sucking )  Audible Swallowing: None  Type of Nipple: Everted at rest and after stimulation  Comfort (Breast/Nipple): Soft / non-tender  Hold (Positioning): Assistance needed to correctly position infant at breast and maintain latch.  LATCH Score: 7  Interventions Interventions: Breast feeding basics  reviewed  Lactation Tools Discussed/Used Tools: Nipple Shields Nipple shield size: 20;24(sized for #20 NS and #24 NS , #24 fit better ) Breast pump type: Double-Electric Breast Pump(# 24 flange a good fit ) Pump Review: Milk Storage(RN had set up this am. mom was pumping one side and LC assisted to fix the other side ) Initiated by:: MAI / Reviewed  Fuller initiated:: 10/24/17   Consult Status Consult Status: Follow-up Fuller: 10/24/17 Follow-up type: In-patient    [name] 10/24/2017, 4:12 PM

## 2017-10-24 NOTE — Anesthesia Postprocedure Evaluation (Signed)
Anesthesia Post Note  Patient: Katrina Fuller  Procedure(s) Performed: PRIMARY CESAREAN SECTION (N/A Abdomen)     Patient location during evaluation: Mother Baby Anesthesia Type: Spinal Level of consciousness: awake and alert Pain management: pain level controlled Vital Signs Assessment: post-procedure vital signs reviewed and stable Respiratory status: spontaneous breathing Cardiovascular status: blood pressure returned to baseline Postop Assessment: no headache, patient able to bend at knees, no backache, no apparent nausea or vomiting, adequate PO intake and spinal receding Anesthetic complications: no    Last Vitals:  Vitals:   10/24/17 0330 10/24/17 0535  BP:  (!) 93/53  Pulse:  90  Resp:  16  Temp:  36.7 C  SpO2: 98% 98%    Last Pain:  Vitals:   10/24/17 0727  TempSrc:   PainSc: 0-No pain   Pain Goal:                 [name], [name]

## 2017-10-25 ENCOUNTER — Other Ambulatory Visit: Payer: Self-pay

## 2017-10-25 ENCOUNTER — Encounter (HOSPITAL_COMMUNITY): Payer: Self-pay | Admitting: *Deleted

## 2017-10-25 LAB — BPAM RBC
BLOOD PRODUCT EXPIRATION DATE: 201812222359
Blood Product Expiration Date: 201812222359
UNIT TYPE AND RH: 9500
UNIT TYPE AND RH: 9500

## 2017-10-25 LAB — TYPE AND SCREEN
ABO/RH(D): O NEG
ANTIBODY SCREEN: POSITIVE
UNIT DIVISION: 0
Unit division: 0

## 2017-10-25 MED ORDER — OXYCODONE HCL 5 MG PO TABS: 5.0000 mg | ORAL_TABLET | ORAL | 0 refills | Status: DC | PRN

## 2017-10-25 MED ORDER — FERROUS SULFATE 325 (65 FE) MG PO TABS: 325.0000 mg | ORAL_TABLET | Freq: Two times a day (BID) | ORAL | 3 refills | Status: DC

## 2017-10-25 MED ORDER — IBUPROFEN 600 MG PO TABS: 600.0000 mg | ORAL_TABLET | Freq: Four times a day (QID) | ORAL | 0 refills | Status: DC | PRN

## 2017-10-25 NOTE — Lactation Note (Signed)
This note was copied from a baby's chart. Lactation Consultation Note  Patient Name: Katrina Fuller ZOXWR'UToday's Date: 10/25/2017  Mom reports baby still not latching but she is happy her milk is in and baby can now receive her milk.  Mom states she has 12 3 ounce bags of milk in freezer at home that she pumped while pregnant.  Instructed to pump every 2-3 hours and give baby 30-40 mls every 2-3 hours.  Encouraged to call out for assist/concerns prn.   Maternal Data    Feeding Feeding Type: Formula Nipple Type: Slow - flow  LATCH Score                   Interventions    Lactation Tools Discussed/Used     Consult Status      [name], Felice Hope S 10/25/2017, 11:59 AM

## 2017-10-25 NOTE — Discharge Instructions (Signed)

## 2017-10-25 NOTE — Discharge Summary (Signed)
OB Discharge Summary     Patient Name: Katrina Fuller DOB: 01-20-1996 MRN: [id number]  Date of admission: 10/23/2017 Delivering MD: Catalina AntiguaONSTANT, PEGGY   Date of discharge: 10/25/2017  Admitting diagnosis: Primary CS Intrauterine pregnancy: 3271w2d     Secondary diagnosis:  Active Problems:   S/P cesarean section  Additional problems: prev 4th deg lac with chronic vag pain; Rh neg; anemia     Discharge diagnosis: Term Pregnancy Delivered and Anemia                                                                                                Post partum procedures:none  Augmentation: N/A  Complications: None  Hospital course:  Sceduled C/S   21 y.o. yo G2P1001 at 5871w2d was admitted to the hospital 10/23/2017 for scheduled cesarean section with the following indication:Elective Primary- prev 4th deg vag lac.  Membrane Rupture Time/Date: 12:26 PM ,10/23/2017   Patient delivered a Viable infant.10/23/2017  Details of operation can be found in separate operative note.  Pateint had an uncomplicated postpartum course.  She is ambulating, tolerating a regular diet, passing flatus, and urinating well. Patient is discharged home in stable condition on  10/25/17 per her request as her PP stay has gone well.         Physical exam  Vitals:   10/24/17 0535 10/24/17 1803 10/25/17 0012 10/25/17 0110  BP: (!) 93/53 100/65 113/82   Pulse: 90 66 69   Resp: 16 16 15 15   Temp: 98 F (36.7 C) 97.7 F (36.5 C) 97.8 F (36.6 C)   TempSrc: Oral Axillary Oral   SpO2: 98% 100% 100%   Weight:      Height:       General: alert and cooperative Lochia: appropriate Uterine Fundus: firm Incision: honeycomb dry and intact DVT Evaluation: No evidence of DVT seen on physical exam. Labs: Lab Results  Component Value Date   WBC 9.8 10/24/2017   HGB 7.8 (L) 10/24/2017   HCT 24.8 (L) 10/24/2017   MCV 71.9 (L) 10/24/2017   PLT 245 10/24/2017   No flowsheet data found.  Discharge instruction: per  After Visit Summary and "Baby and Me Booklet".  After visit meds:  Allergies as of 10/25/2017      Reactions   Latex Rash      Medication List    STOP taking these medications   acetaminophen 325 MG tablet Commonly known as:  TYLENOL   cyclobenzaprine 10 MG tablet Commonly known as:  FLEXERIL     TAKE these medications   ferrous sulfate 325 (65 FE) MG tablet Take 1 tablet (325 mg total) by mouth 2 (two) times daily with a meal.   ibuprofen 600 MG tablet Commonly known as:  ADVIL,MOTRIN Take 1 tablet (600 mg total) by mouth every 6 (six) hours as needed.   oxyCODONE 5 MG immediate release tablet Commonly known as:  Oxy IR/ROXICODONE Take 1 tablet (5 mg total) by mouth every 4 (four) hours as needed (pain scale 4-7).   PRENATAL 28-0.8 MG Tabs Take 1 tablet 2 (two) times daily by mouth.  Diet: routine diet  Activity: Advance as tolerated. Pelvic rest for 6 weeks.   Outpatient follow up:4 weeks Follow up Appt:No future appointments. Follow up Visit:No Follow-up on file.  Postpartum contraception: Natural Family Planning/rhythm  Newborn Data: Live born female  Birth Weight: 6 lb 13.4 oz (3100 g) APGAR: 8, 9  Newborn Delivery   Birth date/time:  10/23/2017 12:27:00 Delivery type:  C-Section, Low Transverse C-section categorization:  Primary     Baby Feeding: Breast Disposition:home with mother   10/25/2017 Cam HaiSHAW, Anniyah Mood, CNM 6:30 AM

## 2017-11-12 ENCOUNTER — Encounter: Payer: Self-pay | Admitting: Obstetrics and Gynecology

## 2017-11-20 ENCOUNTER — Encounter: Payer: Self-pay | Admitting: Advanced Practice Midwife

## 2017-11-20 ENCOUNTER — Ambulatory Visit (INDEPENDENT_AMBULATORY_CARE_PROVIDER_SITE_OTHER): Payer: PRIVATE HEALTH INSURANCE | Admitting: Advanced Practice Midwife

## 2017-11-20 VITALS — BP 119/77 | HR 73 | Resp 16 | Ht 60.0 in | Wt 160.0 lb

## 2017-11-20 DIAGNOSIS — Z1389 Encounter for screening for other disorder: Secondary | ICD-10-CM

## 2017-11-20 DIAGNOSIS — Z98891 History of uterine scar from previous surgery: Secondary | ICD-10-CM

## 2017-11-20 NOTE — Progress Notes (Signed)
Post Partum Exam  Katrina Fuller is a 21 y.o. [id number] female who presents for a postpartum visit. She is 4 weeks postpartum following a primary low transverse Cesarean section. I have fully reviewed the prenatal and intrapartum course. The delivery was at 39 gestational weeks.  Anesthesia: Spinal. Postpartum course has been unremarkable. Baby's course has been unremarkable. Baby is feeding by breast. Bleeding staining only. Bowel function is normal. Bladder function is normal. Patient is sexually active. Contraception method is none. Postpartum depression screening:neg  The following portions of the patient's history were reviewed and updated as appropriate: past family history, past medical history, past social history and past surgical history. Obstetric History was reviewed and updated additionally.   Review of Systems Pertinent items noted in HPI and remainder of comprehensive ROS otherwise negative.    Objective:  Blood pressure 119/77, pulse 73, resp. rate 16, height 5' (1.524 m), weight 160 lb (72.6 kg), currently breastfeeding.  General:  alert, cooperative and no distress   Breasts:  inspection negative, no nipple discharge or bleeding, no masses or nodularity palpable  Lungs: clear to auscultation bilaterally  Heart:  regular rate and rhythm, S1, S2 normal, no murmur, click, rub or gallop  Abdomen: soft, non-tender; bowel sounds normal; no masses,  no organomegaly and C/S incision- steristrips still in place, incision intact and healed under with no edema or redness   Vulva:  not evaluated  Vagina: not evaluated  Cervix:  not evaluated  Corpus: not examined  Adnexa:  not evaluated  Rectal Exam: Not performed.        Assessment/Plan:   1. S/P cesarean section -Educated and discussed removal of steri strips in shower. Incision intact and healed with no complications. Patient agrees with removal dressing since 4w PP.  -Patient states some pulling at incision site when moving and  taking care of 5614 month old and newborn. Discussed use of maternity support belt PP but not continuously to allow incision to breath.  2. Postpartum care and examination -Normal postpartum exam with no complications. Pap smear not done at today's visit- last PAP in 2016, patient plans to get PAP in 2019 at well woman visit.  -Contraception: Natural family planning/rhythm -Educated on using natural family planning for contraception. Discussed pregnancy spacing- giving at least 12 months during pregnancies especially after primary C/S to allow uterus incision to fully heal.  -Follow up in: 1 year for annual or as needed.

## 2017-12-08 ENCOUNTER — Encounter: Payer: Self-pay | Admitting: Obstetrics & Gynecology

## 2018-09-09 ENCOUNTER — Emergency Department
Admission: EM | Admit: 2018-09-09 | Discharge: 2018-09-09 | Payer: Managed Care, Other (non HMO) | Source: Home / Self Care

## 2018-11-26 ENCOUNTER — Encounter: Payer: Self-pay | Admitting: *Deleted

## 2018-11-26 ENCOUNTER — Other Ambulatory Visit: Payer: Self-pay

## 2018-11-26 ENCOUNTER — Emergency Department
Admission: EM | Admit: 2018-11-26 | Discharge: 2018-11-26 | Disposition: A | Discharge status: Home / Self Care | Payer: Managed Care, Other (non HMO) | Source: Home / Self Care | Attending: Family Medicine | Admitting: Family Medicine

## 2018-11-26 DIAGNOSIS — K0889 Other specified disorders of teeth and supporting structures: Secondary | ICD-10-CM | POA: Diagnosis not present

## 2018-11-26 DIAGNOSIS — K029 Dental caries, unspecified: Secondary | ICD-10-CM

## 2018-11-26 MED ORDER — AMOXICILLIN 875 MG PO TABS: 875.0000 mg | ORAL_TABLET | Freq: Two times a day (BID) | ORAL | 0 refills | Status: DC

## 2018-11-26 MED ORDER — HYDROCODONE-ACETAMINOPHEN 5-325 MG PO TABS: ORAL_TABLET | ORAL | 0 refills | Status: DC

## 2018-11-26 NOTE — ED Triage Notes (Signed)
Pt c/o LT side dental pain x 2 wks. denies fever. She has an appt with her dentist on 12/02/2018.

## 2018-11-26 NOTE — Discharge Instructions (Addendum)
May take Ibuprofen 200mg , 4 tabs every 8 hours with food.  If symptoms become significantly worse during the night or over the weekend, proceed to the local emergency room.   Apply ice to the painful area of your face: Put ice in a plastic bag. Place a towel between your skin and the bag. Leave the ice on for 15 minutes, 2-3 times a day.

## 2018-11-26 NOTE — ED Provider Notes (Signed)
[facility]    CSN: [id number] Arrival date & time: 11/26/18  1150     History   Chief Complaint Chief Complaint  Patient presents with  . Dental Pain    HPI Katrina Fuller is a 22 y.o. female.   The history is provided by the patient.  Dental Pain  Location:  Lower Lower teeth location:  21/LL 1st bicuspid Quality:  Aching and localized Severity:  Moderate Onset quality:  Gradual Duration:  2 weeks Timing:  Constant Progression:  Worsening Chronicity:  Recurrent Context: poor dentition   Relieved by:  Nothing Worsened by:  Touching and pressure Ineffective treatments:  NSAIDs Associated symptoms: gum swelling   Associated symptoms: no congestion, no difficulty swallowing, no drooling, no facial pain, no facial swelling, no fever, no headaches, no neck pain, no neck swelling, no oral bleeding, no oral lesions and no trismus   Risk factors: periodontal disease     History reviewed. No pertinent past medical history.  Patient Active Problem List   Diagnosis Date Noted  . S/P cesarean section 10/23/2017  . Cellulitis and abscess of leg 10/07/2017  . Anemia affecting pregnancy, antepartum 08/27/2017  . History of diet controlled gestational diabetes mellitus (GDM) 04/15/2017  . Family history of breast cancer 03/20/2017  . Rh negative state in antepartum period 03/19/2017  . Supervision of normal pregnancy 03/18/2017  . History of gestational hypertension 08/01/2016  . Fourth degree laceration of perineum during delivery, postpartum 07/31/2016  . Postpartum episiotomy dehiscence 07/31/2016  . Nonintractable episodic headache 01/30/2016    Past Surgical History:  Procedure Laterality Date  . CESAREAN SECTION N/A 10/23/2017   Procedure: PRIMARY CESAREAN SECTION;  Surgeon: Catalina Antiguaonstant, Peggy, MD;  [facility]: [facility];  Service: Obstetrics;  Laterality: N/A;  . chin surgery     5 surgeries for cyst on chin    OB History    Gravida  2   Para  2   Term  2   Preterm      AB      Living  2     SAB      TAB      Ectopic      Multiple      Live Births  2        Obstetric Comments  4th degree laceration with first delivery         Home Medications    Prior to Admission medications   Medication Sig Start Date End Date Taking? Authorizing Provider  Multiple Vitamin (MULTIVITAMIN) tablet Take 1 tablet by mouth daily.   Yes [provider]  amoxicillin (AMOXIL) 875 MG tablet Take 1 tablet (875 mg total) by mouth 2 (two) times daily. 11/26/18   Lattie HawBeese, Makara Lanzo A, MD  HYDROcodone-acetaminophen (NORCO/VICODIN) 5-325 MG tablet Take one by mouth at bedtime as needed for pain.  May repeat 4 to 6 hours later 11/26/18   Lattie HawBeese, Dyllan Kats A, MD    Family History Family History  Problem Relation Age of Onset  . Diabetes Mother   . Hypertension Mother   . Diverticulitis Mother   . Multiple sclerosis Father   . Diabetes Father   . Hypertension Father   . Diverticulitis Father   . Breast cancer Sister   . Breast cancer Paternal Grandmother   . Breast cancer Paternal Aunt        4 P aunts with breast cancer    Social History Social History   Tobacco Use  . Smoking status:  Current Every Day Smoker    Packs/day: 0.50    Types: Cigarettes  . Smokeless tobacco: Never Used  Substance Use Topics  . Alcohol use: No  . Drug use: No     Allergies   Latex   Review of Systems Review of Systems  Constitutional: Negative for activity change, appetite change, chills, fatigue and fever.  HENT: Negative for congestion, drooling, facial swelling and mouth sores.   Musculoskeletal: Negative for neck pain.  Neurological: Negative for headaches.  All other systems reviewed and are negative.    Physical Exam Triage Vital Signs ED Triage Vitals [11/26/18 1208]  Enc Vitals Group     BP 112/82     Pulse Rate 81     Resp 18     Temp 98 F (36.7 C)     Temp Source Oral     SpO2 97 %     Weight 182 lb  (82.6 kg)     Height 5' (1.524 m)     Head Circumference      Peak Flow      Pain Score 5     Pain Loc      Pain Edu?      Excl. in GC?    No data found.  Updated Vital Signs BP 112/82 (BP Location: Right Arm)   Pulse 81   Temp 98 F (36.7 C) (Oral)   Resp 18   Ht 5' (1.524 m)   Wt 82.6 kg   LMP 11/12/2018   SpO2 97%   BMI 35.54 kg/m   Visual Acuity Right Eye Distance:   Left Eye Distance:   Bilateral Distance:    Right Eye Near:   Left Eye Near:    Bilateral Near:     Physical Exam Vitals signs and nursing note reviewed.  Constitutional:      General: She is not in acute distress.    Appearance: Normal appearance. She is not ill-appearing or diaphoretic.  HENT:     Head: Atraumatic.     Right Ear: Tympanic membrane, ear canal and external ear normal. There is no impacted cerumen.     Left Ear: Tympanic membrane, ear canal and external ear normal. There is no impacted cerumen.     Nose: Nose normal.     Mouth/Throat:     Mouth: Mucous membranes are moist.     Dentition: Abnormal dentition. Dental tenderness and dental caries present. No gingival swelling or gum lesions.     Pharynx: Oropharynx is clear.      Comments: Multiple missing teeth and eroded remaining teeth.  Tooth #21 eroded and tender to tap.  Adjacent gingiva tender to palpation but not fluctuant.  Mild tenderness over left submandibular area without swelling. Eyes:     Extraocular Movements: Extraocular movements intact.     Conjunctiva/sclera: Conjunctivae normal.     Pupils: Pupils are equal, round, and reactive to light.  Neck:     Musculoskeletal: Neck supple.  Cardiovascular:     Rate and Rhythm: Normal rate.  Pulmonary:     Effort: Pulmonary effort is normal.  Neurological:     Mental Status: She is alert.      UC Treatments / Results  Labs (all labs ordered are listed, but only abnormal results are displayed) Labs Reviewed - No data to display  EKG None  Radiology No  results found.  Procedures Procedures (including critical care time)  Medications Ordered in UC Medications - No data to display  Initial Impression / Assessment and Plan / UC Course  I have reviewed the triage vital signs and the nursing notes.  Pertinent labs & imaging results that were available during my care of the patient were reviewed by me and considered in my medical decision making (see chart for details).    Begin amoxicillin.  Rx for Lortab for pain at night (Rx #10, no refill) Controlled Substance Prescriptions I have consulted the [place] Controlled Substances Registry for this patient, and feel the risk/benefit ratio today is favorable for proceeding with this prescription for a controlled substance.  Followup with dentist as scheduled.   Final Clinical Impressions(s) / UC Diagnoses   Final diagnoses:  Pain, dental  Dental caries     Discharge Instructions     May take Ibuprofen 200mg , 4 tabs every 8 hours with food.  If symptoms become significantly worse during the night or over the weekend, proceed to the local emergency room.    Apply ice to the painful area of your face: ? Put ice in a plastic bag. ? Place a towel between your skin and the bag. ? Leave the ice on for 15 minutes, 2-3 times a day.   ED Prescriptions    Medication Sig Dispense Auth. Provider   amoxicillin (AMOXIL) 875 MG tablet Take 1 tablet (875 mg total) by mouth 2 (two) times daily. 20 tablet Lattie HawBeese, Vinessa Macconnell A, MD   HYDROcodone-acetaminophen (NORCO/VICODIN) 5-325 MG tablet Take one by mouth at bedtime as needed for pain.  May repeat 4 to 6 hours later 10 tablet Cathren HarshBeese, Tera MaterStephen A, MD         Lattie HawBeese, Jemmie Ledgerwood A, MD 11/26/18 [phone]

## 2019-01-17 ENCOUNTER — Other Ambulatory Visit: Payer: Self-pay

## 2019-01-17 ENCOUNTER — Encounter: Payer: Self-pay | Admitting: Emergency Medicine

## 2019-01-17 ENCOUNTER — Emergency Department
Admission: EM | Admit: 2019-01-17 | Discharge: 2019-01-17 | Disposition: A | Discharge status: Home / Self Care | Payer: Managed Care, Other (non HMO) | Source: Home / Self Care

## 2019-01-17 DIAGNOSIS — Z20828 Contact with and (suspected) exposure to other viral communicable diseases: Secondary | ICD-10-CM | POA: Diagnosis not present

## 2019-01-17 DIAGNOSIS — J069 Acute upper respiratory infection, unspecified: Secondary | ICD-10-CM

## 2019-01-17 DIAGNOSIS — B9789 Other viral agents as the cause of diseases classified elsewhere: Secondary | ICD-10-CM

## 2019-01-17 LAB — POCT INFLUENZA A/B
Influenza A, POC: NEGATIVE
Influenza B, POC: NEGATIVE

## 2019-01-17 MED ORDER — BENZONATATE 100 MG PO CAPS: 100.0000 mg | ORAL_CAPSULE | Freq: Three times a day (TID) | ORAL | 0 refills | Status: DC

## 2019-01-17 MED ORDER — IPRATROPIUM BROMIDE 0.06 % NA SOLN: 2.0000 | Freq: Four times a day (QID) | NASAL | 1 refills | Status: DC

## 2019-01-17 NOTE — Discharge Instructions (Signed)
  You may take 500mg acetaminophen every 4-6 hours or in combination with ibuprofen 400-600mg every 6-8 hours as needed for pain, inflammation, and fever.  Be sure to well hydrated with clear liquids and get at least 8 hours of sleep at night, preferably more while sick.   Please follow up with family medicine in 1 week if needed.   

## 2019-01-17 NOTE — ED Triage Notes (Signed)
Nausea, diarrhea, fever x 4 days Swollen glands, cough, exposure to flu

## 2019-01-17 NOTE — ED Provider Notes (Signed)
[facility]    CSN: [id number] Arrival date & time: 01/17/19  [id number]     History   Chief Complaint Chief Complaint  Patient presents with  . Cough    HPI Katrina Fuller is a 23 y.o. female.   HPI Katrina Fuller is a 23 y.o. female presenting to UC with c/o n/v/d that started 3 days ago, resolved 2 days ago, but then developed cough, congestion, and sore throat.  Co-workers and family members have been sick, some with the flu.  She had a fever Tmax 101*F 2 days ago but none since then.  Denies chest pain or SOB. She has taken Tylenol Cold & Flu with mild relief. She did not receive the flu vaccine.    History reviewed. No pertinent past medical history.  Patient Active Problem List   Diagnosis Date Noted  . S/P cesarean section 10/23/2017  . Cellulitis and abscess of leg 10/07/2017  . Anemia affecting pregnancy, antepartum 08/27/2017  . History of diet controlled gestational diabetes mellitus (GDM) 04/15/2017  . Family history of breast cancer 03/20/2017  . Rh negative state in antepartum period 03/19/2017  . Supervision of normal pregnancy 03/18/2017  . History of gestational hypertension 08/01/2016  . Fourth degree laceration of perineum during delivery, postpartum 07/31/2016  . Postpartum episiotomy dehiscence 07/31/2016  . Nonintractable episodic headache 01/30/2016    Past Surgical History:  Procedure Laterality Date  . CESAREAN SECTION N/A 10/23/2017   Procedure: PRIMARY CESAREAN SECTION;  Surgeon: Catalina Antiguaonstant, Peggy, MD;  Location: [facility];  Service: Obstetrics;  Laterality: N/A;  . chin surgery     5 surgeries for cyst on chin    OB History    Gravida  2   Para  2   Term  2   Preterm      AB      Living  2     SAB      TAB      Ectopic      Multiple      Live Births  2        Obstetric Comments  4th degree laceration with first delivery         Home Medications    Prior to Admission medications   Medication  Sig Start Date End Date Taking? Authorizing Provider  benzonatate (TESSALON) 100 MG capsule Take 1-2 capsules (100-200 mg total) by mouth every 8 (eight) hours. 01/17/19   Lurene ShadowPhelps, [place] Mckeough O, PA-C  ipratropium (ATROVENT) 0.06 % nasal spray Place 2 sprays into both nostrils 4 (four) times daily. 01/17/19   Lurene ShadowPhelps, Madilyne Tadlock O, PA-C  Multiple Vitamin (MULTIVITAMIN) tablet Take 1 tablet by mouth daily.    [provider]    Family History Family History  Problem Relation Age of Onset  . Diabetes Mother   . Hypertension Mother   . Diverticulitis Mother   . Multiple sclerosis Father   . Diabetes Father   . Hypertension Father   . Diverticulitis Father   . Breast cancer Sister   . Breast cancer Paternal Grandmother   . Breast cancer Paternal Aunt        4 P aunts with breast cancer    Social History Social History   Tobacco Use  . Smoking status: Current Every Day Smoker    Packs/day: 0.50    Types: Cigarettes  . Smokeless tobacco: Never Used  Substance Use Topics  . Alcohol use: No  . Drug use: No     Allergies  Latex   Review of Systems Review of Systems  Constitutional: Positive for chills and fever.  HENT: Positive for sore throat. Negative for congestion, ear pain, trouble swallowing and voice change.   Respiratory: Positive for cough. Negative for shortness of breath.   Cardiovascular: Negative for chest pain and palpitations.  Gastrointestinal: Positive for diarrhea, nausea and vomiting. Negative for abdominal pain.  Musculoskeletal: Negative for arthralgias, back pain and myalgias.  Skin: Negative for rash.  Neurological: Positive for headaches. Negative for dizziness and light-headedness.     Physical Exam Triage Vital Signs ED Triage Vitals  Enc Vitals Group     BP 01/17/19 1025 117/75     Pulse Rate 01/17/19 1025 91     Resp --      Temp 01/17/19 1025 98 F (36.7 C)     Temp Source 01/17/19 1025 Oral     SpO2 01/17/19 1025 100 %     Weight 01/17/19  1026 170 lb (77.1 kg)     Height 01/17/19 1026 5' (1.524 m)     Head Circumference --      Peak Flow --      Pain Score 01/17/19 1026 3     Pain Loc --      Pain Edu? --      Excl. in GC? --    No data found.  Updated Vital Signs BP 117/75 (BP Location: Right Arm)   Pulse 91   Temp 98 F (36.7 C) (Oral)   Ht 5' (1.524 m)   Wt 170 lb (77.1 kg)   SpO2 100%   BMI 33.20 kg/m   Visual Acuity Right Eye Distance:   Left Eye Distance:   Bilateral Distance:    Right Eye Near:   Left Eye Near:    Bilateral Near:     Physical Exam Vitals signs and nursing note reviewed.  Constitutional:      Appearance: Normal appearance. She is well-developed.  HENT:     Head: Normocephalic and atraumatic.     Right Ear: Tympanic membrane normal.     Left Ear: Tympanic membrane normal.     Nose: Nose normal.     Right Sinus: No maxillary sinus tenderness or frontal sinus tenderness.     Left Sinus: No maxillary sinus tenderness or frontal sinus tenderness.     Mouth/Throat:     Lips: Pink.     Mouth: Mucous membranes are moist.     Pharynx: Oropharynx is clear. Uvula midline.  Neck:     Musculoskeletal: Normal range of motion and neck supple. No muscular tenderness.  Cardiovascular:     Rate and Rhythm: Normal rate and regular rhythm.  Pulmonary:     Effort: Pulmonary effort is normal. No respiratory distress.     Breath sounds: Normal breath sounds. No stridor. No wheezing or rhonchi.  Musculoskeletal: Normal range of motion.  Lymphadenopathy:     Cervical: Cervical adenopathy present.  Skin:    General: Skin is warm and dry.  Neurological:     Mental Status: She is alert and oriented to person, place, and time.  Psychiatric:        Behavior: Behavior normal.      UC Treatments / Results  Labs (all labs ordered are listed, but only abnormal results are displayed) Labs Reviewed  POCT INFLUENZA A/B    EKG None  Radiology No results found.  Procedures Procedures  (including critical care time)  Medications Ordered in UC Medications - No data  to display  Initial Impression / Assessment and Plan / UC Course  I have reviewed the triage vital signs and the nursing notes.  Pertinent labs & imaging results that were available during my care of the patient were reviewed by me and considered in my medical decision making (see chart for details).     Rapid flu: NEGATIVE Symptoms started over 3 days ago. GI symptoms have resolved. Encouraged symptomatic treatment for viral URI AVS provided  Final Clinical Impressions(s) / UC Diagnoses   Final diagnoses:  Exposure to the flu  Viral URI with cough     Discharge Instructions      You may take 500mg  acetaminophen every 4-6 hours or in combination with ibuprofen 400-600mg  every 6-8 hours as needed for pain, inflammation, and fever.  Be sure to well hydrated with clear liquids and get at least 8 hours of sleep at night, preferably more while sick.   Please follow up with family medicine in 1 week if needed.     ED Prescriptions    Medication Sig Dispense Auth. Provider   benzonatate (TESSALON) 100 MG capsule Take 1-2 capsules (100-200 mg total) by mouth every 8 (eight) hours. 21 capsule Doroteo Glassman, Rheta Hemmelgarn O, PA-C   ipratropium (ATROVENT) 0.06 % nasal spray Place 2 sprays into both nostrils 4 (four) times daily. 15 mL Lurene Shadow, PA-C     Controlled Substance Prescriptions [place] Controlled Substance Registry consulted? Not Applicable   Rolla Plate 01/17/19 1212

## 2019-03-02 IMAGING — US US MFM OB DETAIL+14 WK
1 series · 14 of 28 positions shown · non-contrast
Comparison: none

[Series 1: us mfm ob detail+14 wk · 117 acquisitions, 14 frames shown]
[im 5/117]
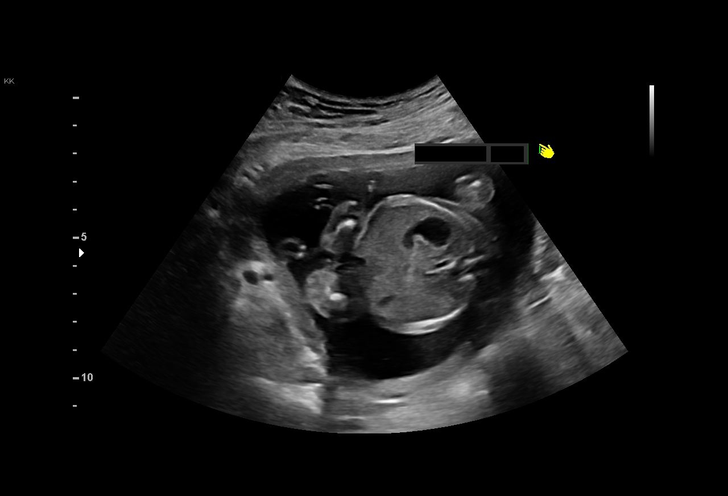
[im 13/117]
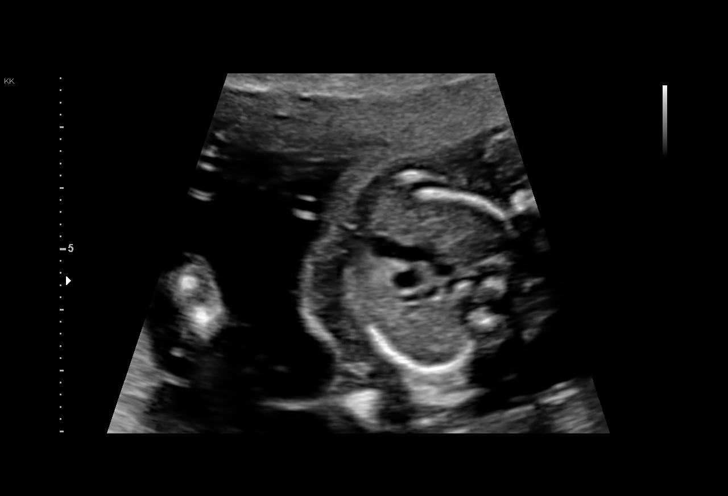
[im 22/117]
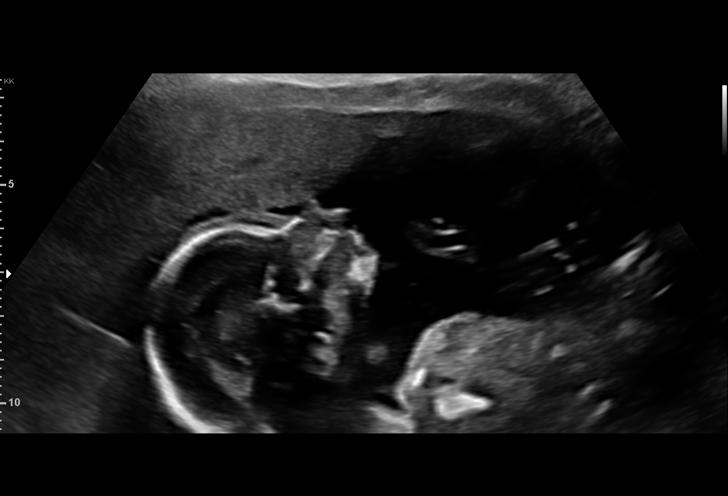
[im 31/117]
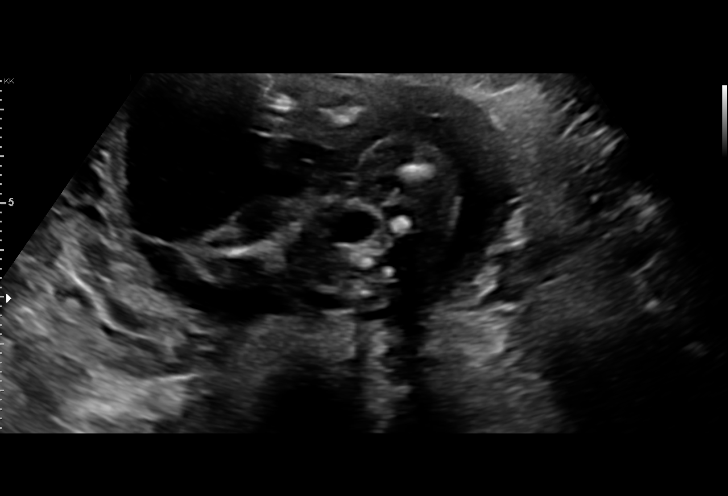
[im 39/117]
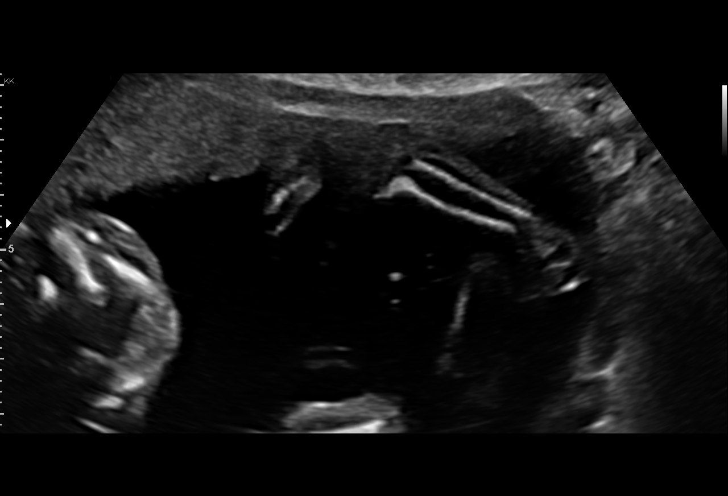
[im 48/117]
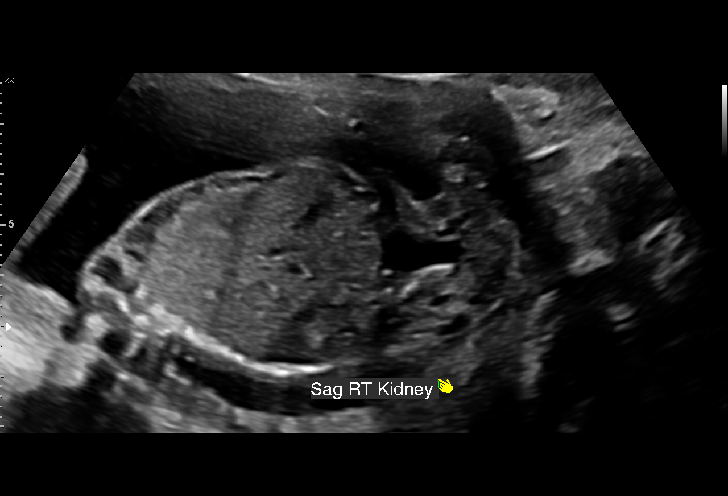
[im 56/117]
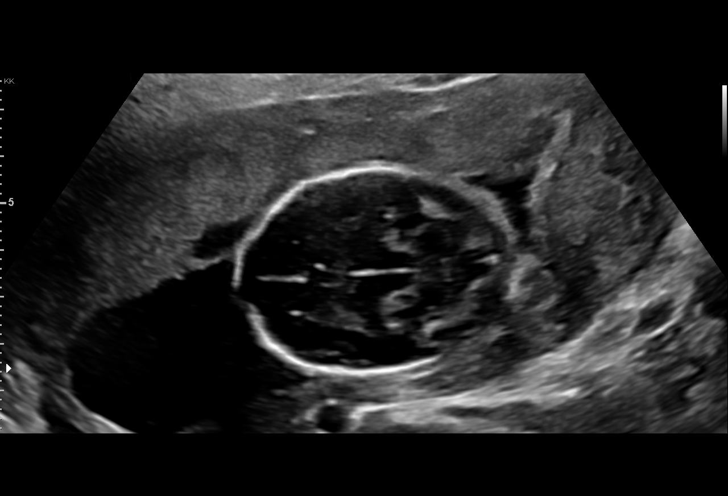
[im 65/117]
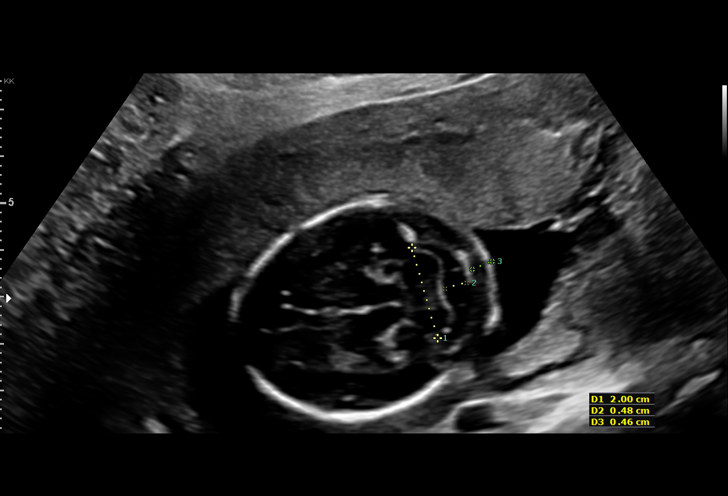
[im 74/117]
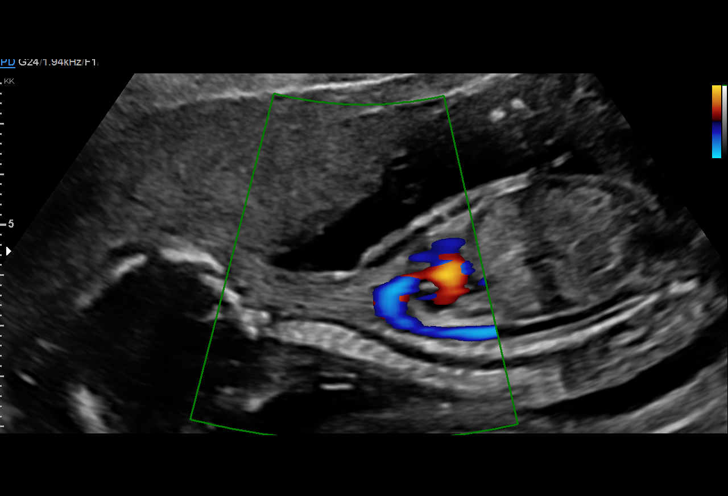
[im 82/117]
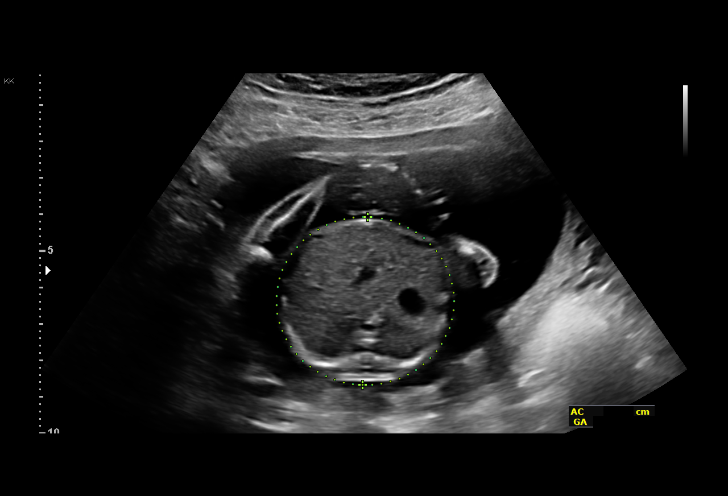
[im 91/117]
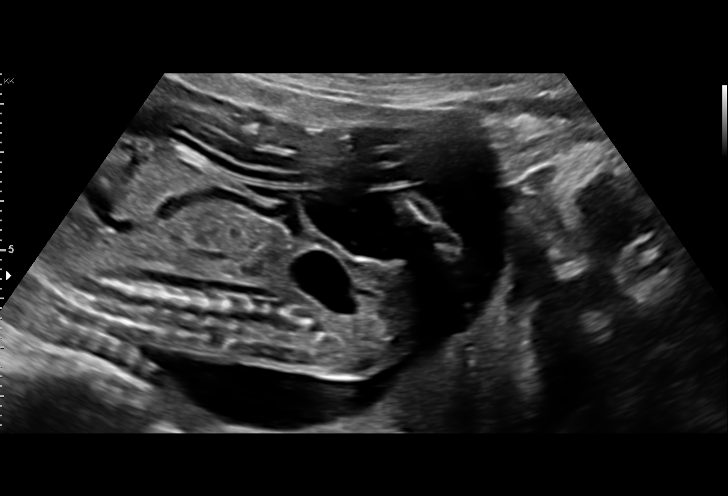
[im 99/117]
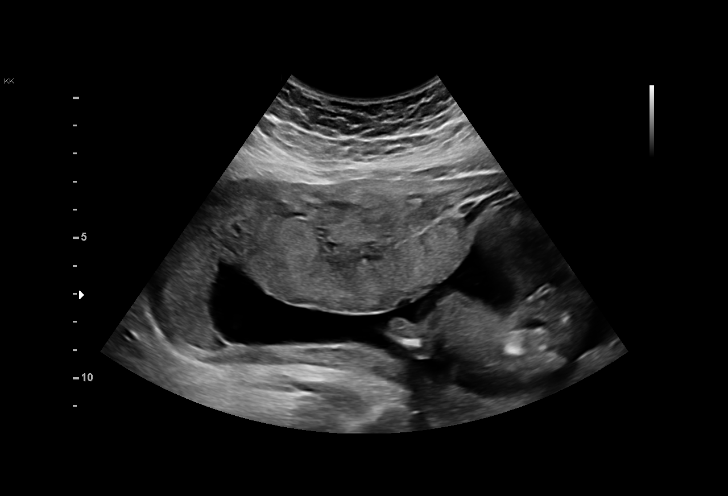
[im 108/117]
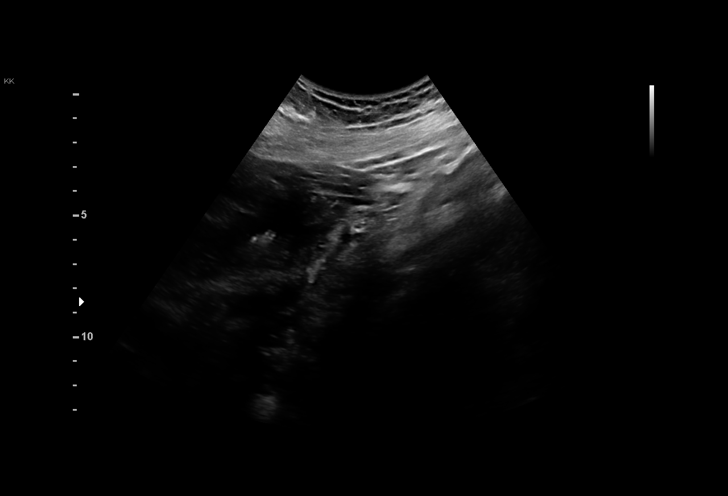
[im 117/117]
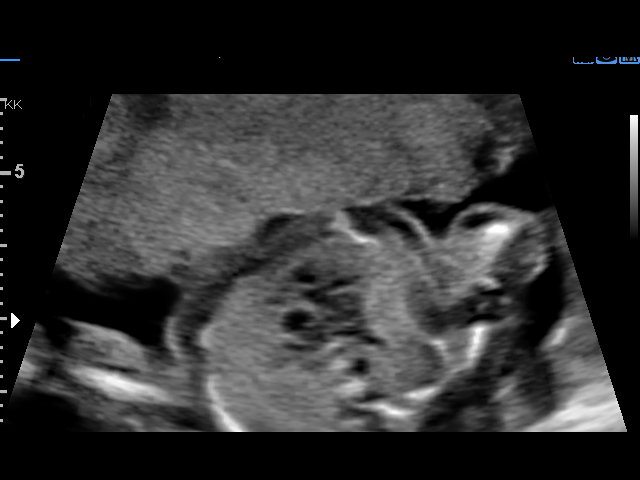

[14 of 28 positions shown; findings below may reference images not displayed]

1  JEAN-NO ROMDHANI           [id number]       [id number]     [id number]
Indications

19 weeks gestation of pregnancy
Encounter for antenatal screening for
malformations
Obesity complicating pregnancy, second
trimester
OB History

Blood Type:            Height:  5'0"   Weight (lb):  165      BMI:
Gravidity:    2         Term:   1        Prem:   0        SAB:   0
TOP:          0       Ectopic:  0        Living: 1
Fetal Evaluation

Num Of Fetuses:     1
Fetal Heart         150
Rate(bpm):
Cardiac Activity:   Observed
Presentation:       Breech
Placenta:           Anterior, above cervical os
P. Cord Insertion:  Visualized

Amniotic Fluid
AFI FV:      Subjectively within normal limits

Largest Pocket(cm)
4.8
Biometry

BPD:      44.2  mm     G. Age:  19w 3d         48  %    CI:         68.7   %   70 - 86
FL/HC:      16.9   %   16.1 -
HC:      170.4  mm     G. Age:  19w 4d         53  %    HC/AC:      1.15       1.09 -
AC:      147.8  mm     G. Age:  20w 1d         66  %    FL/BPD:     65.2   %
FL:       28.8  mm     G. Age:  18w 6d         23  %    FL/AC:      19.5   %   20 - 24
NFT:       4.6  mm

Est. FW:     296  gm    0 lb 10 oz      48  %
Gestational Age

LMP:           25w 0d       Date:   12/15/16                 EDD:   09/21/17
U/S Today:     19w 4d                                        EDD:   10/29/17
Best:          19w 3d    Det. By:   Early Ultrasound         EDD:   10/30/17
(03/18/17)
Anatomy

Cranium:               Appears normal         Aortic Arch:            Appears normal
Cavum:                 Appears normal         Ductal Arch:            Appears normal
Ventricles:            Appears normal         Diaphragm:              Appears normal
Choroid Plexus:        Appears normal         Stomach:                Appears normal, left
sided
Cerebellum:            Appears normal         Abdomen:                Appears normal
Posterior Fossa:       Appears normal         Abdominal Wall:         Appears nml (cord
insert, abd wall)
Nuchal Fold:           Appears normal         Cord Vessels:           Appears normal (3
vessel cord)
Face:                  Appears normal         Kidneys:                Appear normal
(orbits and profile)
Lips:                  Appears normal         Bladder:                Appears normal
Thoracic:              Appears normal         Spine:                  Not well visualized
Heart:                 Appears normal         Upper Extremities:      Appears normal
(4CH, axis, and situs
RVOT:                  Appears normal         Lower Extremities:      Not well visualized
LVOT:                  Appears normal

Other:  Female gender. Heels and 5th digit visualized. Technically difficult
due to maternal habitus and fetal position.
Cervix Uterus Adnexa

Cervix
Length:            3.9  cm.
Normal appearance by transabdominal scan.

Adnexa:       No abnormality visualized.
Impression

SIUP at 19+3 weeks
Normal detailed fetal anatomy; limited views of spine
Markers of aneuploidy: none
Normal amniotic fluid volume
Measurements consistent with prior US
Recommendations

Follow-up ultrasound in 4-6 weeks to complete anatomy
survey or follow-up as clinically indicated

## 2019-10-18 ENCOUNTER — Telehealth: Payer: Self-pay | Admitting: *Deleted

## 2019-10-18 NOTE — Telephone Encounter (Signed)
Left patient a message to answer screening questions via [facility] prior to her appointment on 10/19/2019 at 10:45 AM with a 10:30 AM arrival.

## 2019-10-19 ENCOUNTER — Other Ambulatory Visit: Payer: Self-pay

## 2019-10-19 ENCOUNTER — Encounter: Payer: Self-pay | Admitting: Obstetrics & Gynecology

## 2019-10-19 ENCOUNTER — Ambulatory Visit (INDEPENDENT_AMBULATORY_CARE_PROVIDER_SITE_OTHER): Payer: Managed Care, Other (non HMO) | Admitting: Obstetrics & Gynecology

## 2019-10-19 VITALS — BP 111/82 | HR 87 | Resp 16 | Ht 60.0 in | Wt 163.0 lb

## 2019-10-19 DIAGNOSIS — Z803 Family history of malignant neoplasm of breast: Secondary | ICD-10-CM

## 2019-10-19 DIAGNOSIS — Z3009 Encounter for other general counseling and advice on contraception: Secondary | ICD-10-CM | POA: Diagnosis not present

## 2019-10-19 MED ORDER — NORGESTREL-ETHINYL ESTRADIOL 0.3-30 MG-MCG PO TABS: 1.0000 | ORAL_TABLET | Freq: Every day | ORAL | 11 refills | Status: DC

## 2019-10-19 NOTE — Progress Notes (Signed)
   Subjective:    Patient ID: Katrina Fuller, female    DOB: [birth year], 23 y.o.   MRN: [id number]  HPI 23 yo separated P2 (3 and 2 yo kids) here today to discuss contraception. She is has been separated for 5+ months. She has been abstinent since separation. She has used Nexplanon in the past but nothing else. She had heavy periods with that. In general her periods are heavy and painful and last 7 days. She says that she is good at taking a pill daily.   Review of Systems Pap due but she does not have time to do that today. She had the Gardasil series. Fh- + breast cancer in paternal GM, paternal great aunts, maternal GM and 4 maternal aunts    Objective:   Physical Exam Breathing, conversing, and ambulating normally Well nourished, well hydrated White female, no apparent distress     Assessment & Plan:  Contraception- start lo ovral with first day of NMP Rec back up for a month, condoms always for STI protection and also with ABX Schedule annual with fasting labs She already got a flu vaccine 2 weeks ago. Strong FH of breast cancer- [employer]

## 2019-11-08 ENCOUNTER — Other Ambulatory Visit: Payer: Self-pay

## 2019-11-08 DIAGNOSIS — Z20822 Contact with and (suspected) exposure to covid-19: Secondary | ICD-10-CM

## 2019-11-10 ENCOUNTER — Telehealth: Payer: Self-pay | Admitting: General Practice

## 2019-11-10 LAB — NOVEL CORONAVIRUS, NAA: SARS-CoV-2, NAA: NOT DETECTED

## 2019-11-10 NOTE — Telephone Encounter (Signed)
° °  Pt rec neg COVID results °

## 2019-11-14 ENCOUNTER — Telehealth: Payer: Self-pay

## 2019-11-14 NOTE — Telephone Encounter (Signed)
Patient requesting to have result faxed to [facility] at [place]. Fax: [phone]. Request will be sent to Team Lead

## 2019-11-14 NOTE — Telephone Encounter (Signed)
Per patient request, faxed COVID test results to [facility] @ [place] @ [phone].

## 2019-11-16 ENCOUNTER — Ambulatory Visit: Payer: Managed Care, Other (non HMO) | Admitting: Obstetrics & Gynecology

## 2020-01-04 ENCOUNTER — Other Ambulatory Visit: Payer: Self-pay

## 2020-01-04 ENCOUNTER — Emergency Department (INDEPENDENT_AMBULATORY_CARE_PROVIDER_SITE_OTHER)
Admission: EM | Admit: 2020-01-04 | Discharge: 2020-01-04 | Disposition: A | Discharge status: Home / Self Care | Payer: Managed Care, Other (non HMO) | Source: Home / Self Care | Attending: Family Medicine | Admitting: Family Medicine

## 2020-01-04 ENCOUNTER — Encounter: Payer: Self-pay | Admitting: *Deleted

## 2020-01-04 DIAGNOSIS — J029 Acute pharyngitis, unspecified: Secondary | ICD-10-CM | POA: Diagnosis not present

## 2020-01-04 LAB — POCT RAPID STREP A (OFFICE): Rapid Strep A Screen: NEGATIVE

## 2020-01-04 MED ORDER — LIDOCAINE VISCOUS HCL 2 % MT SOLN: 15.0000 mL | Freq: Three times a day (TID) | OROMUCOSAL | 0 refills | Status: DC

## 2020-01-04 MED ORDER — PREDNISONE 20 MG PO TABS: ORAL_TABLET | ORAL | 0 refills | Status: DC

## 2020-01-04 NOTE — Discharge Instructions (Signed)
If cold-like symptoms develop, try the following: Take plain guaifenesin (1200mg  extended release tabs such as Mucinex) twice daily, with plenty of water, for cough and congestion.  May add Pseudoephedrine (30mg , one or two every 4 to 6 hours) for sinus congestion.  Get adequate rest.   May use Afrin nasal spray (or generic oxymetazoline) each morning for about 5 days and then discontinue.  Also recommend using saline nasal spray several times daily and saline nasal irrigation (AYR is a common brand).  Use Flonase nasal spray each morning after using Afrin nasal spray and saline nasal irrigation. Try warm salt water gargles for sore throat.  Stop all antihistamines for now, and other non-prescription cough/cold preparations. May take Tylenol as needed for pain. May take Delsym Cough Suppressant at bedtime for nighttime cough.   Isolate yourself until COVID-19 test result is available.   If your COVID19 test is positive, then you are infected with the novel coronavirus and could give the virus to others.  Please continue isolation at home for at least 10 days since the start of your symptoms.   Once you complete your 10 day quarantine, you may return to normal activities as long as you've not had a fever for over 24 hours (without taking fever reducing medicine) and your symptoms are improving. Please continue good preventive care measures, including:  frequent hand-washing, avoid touching your face, cover coughs/sneezes, stay out of crowds and keep a 6 foot distance from others.  Go to the nearest hospital emergency room if fever/cough/breathlessness are severe or illness seems like a threat to life.

## 2020-01-04 NOTE — ED Provider Notes (Signed)
[facility]    CSN: [id number] Arrival date & time: 01/04/20  2119      History   Chief Complaint Chief Complaint  Patient presents with  . Cough  . Sore Throat  . Otalgia    HPI Katrina Fuller is a 24 y.o. female.   4 days ago patient developed right earache, followed by left earache.  She then developed swelling/pain in right lateral lymph nodes and sore throat.  The sore throat has become worse and she has also developed headache, nasal congestion, and mild cough.   She denies chest tightness, shortness of breath, and changes in taste/smell.    The history is provided by the patient.    History reviewed. No pertinent past medical history.  Patient Active Problem List   Diagnosis Date Noted  . S/P cesarean section 10/23/2017  . Cellulitis and abscess of leg 10/07/2017  . Anemia affecting pregnancy, antepartum 08/27/2017  . History of diet controlled gestational diabetes mellitus (GDM) 04/15/2017  . Family history of breast cancer 03/20/2017  . Rh negative state in antepartum period 03/19/2017  . Supervision of normal pregnancy 03/18/2017  . History of gestational hypertension 08/01/2016  . Fourth degree laceration of perineum during delivery, postpartum 07/31/2016  . Postpartum episiotomy dehiscence 07/31/2016  . Nonintractable episodic headache 01/30/2016    Past Surgical History:  Procedure Laterality Date  . CESAREAN SECTION N/A 10/23/2017   Procedure: PRIMARY CESAREAN SECTION;  Surgeon: Catalina Antigua, MD;  Location: [facility];  Service: Obstetrics;  Laterality: N/A;  . chin surgery     5 surgeries for cyst on chin    OB History    Gravida  2   Para  2   Term  2   Preterm      AB      Living  2     SAB      TAB      Ectopic      Multiple      Live Births  2        Obstetric Comments  4th degree laceration with first delivery         Home Medications    Prior to Admission medications   Medication Sig  Start Date End Date Taking? Authorizing Provider  Ferrous Sulfate (IRON PO) Take by mouth.   Yes [provider]  ipratropium (ATROVENT) 0.06 % nasal spray Place 2 sprays into both nostrils 4 (four) times daily. 01/17/19   Lurene Shadow, PA-C  lidocaine (XYLOCAINE) 2 % solution Use as directed 15 mLs in the mouth or throat 4 (four) times daily -  before meals and at bedtime. Gargle, then expectorate 01/04/20   Lattie Haw, MD  Multiple Vitamin (MULTIVITAMIN) tablet Take 1 tablet by mouth daily.    [provider]  norgestrel-ethinyl estradiol (LO/OVRAL) 0.3-30 MG-MCG tablet Take 1 tablet by mouth daily. 10/19/19   Allie Bossier, MD  predniSONE (DELTASONE) 20 MG tablet Take one tab by mouth twice daily for 4 days, then one daily for 3 days. Take with food. 01/04/20   Lattie Haw, MD    Family History Family History  Problem Relation Age of Onset  . Diabetes Mother   . Hypertension Mother   . Diverticulitis Mother   . Multiple sclerosis Father   . Diabetes Father   . Hypertension Father   . Diverticulitis Father   . Breast cancer Sister   . Breast cancer Paternal Grandmother   .  Breast cancer Paternal Aunt        4 P aunts with breast cancer    Social History Social History   Tobacco Use  . Smoking status: Current Every Day Smoker    Packs/day: 0.25    Types: Cigarettes  . Smokeless tobacco: Never Used  Substance Use Topics  . Alcohol use: No  . Drug use: No     Allergies   Latex   Review of Systems Review of Systems + sore throat + cough No pleuritic pain No wheezing + nasal congestion + post-nasal drainage No sinus pain/pressure No itchy/red eyes + right earache No hemoptysis No SOB No fever/chills No nausea No vomiting No abdominal pain No diarrhea No urinary symptoms No skin rash + fatigue No myalgias + headache    Physical Exam Triage Vital Signs ED Triage Vitals  Enc Vitals Group     BP 01/04/20 0850 116/84     Pulse  Rate 01/04/20 0850 87     Resp 01/04/20 0850 18     Temp 01/04/20 0850 97.6 F (36.4 C)     Temp Source 01/04/20 0850 Oral     SpO2 01/04/20 0850 98 %     Weight 01/04/20 0848 160 lb (72.6 kg)     Height 01/04/20 0848 4\' 11"  (1.499 m)     Head Circumference --      Peak Flow --      Pain Score 01/04/20 0847 0     Pain Loc --      Pain Edu? --      Excl. in GC? --    No data found.  Updated Vital Signs BP 116/84 (BP Location: Left Arm)   Pulse 87   Temp 97.6 F (36.4 C) (Oral)   Resp 18   Ht 4\' 11"  (1.499 m)   Wt 72.6 kg   LMP 01/04/2020   SpO2 98%   BMI 32.32 kg/m   Visual Acuity Right Eye Distance:   Left Eye Distance:   Bilateral Distance:    Right Eye Near:   Left Eye Near:    Bilateral Near:     Physical Exam Nursing notes and Vital Signs reviewed. Appearance:  Patient appears stated age, and in no acute distress Eyes:  Pupils are equal, round, and reactive to light and accomodation.  Extraocular movement is intact.  Conjunctivae are not inflamed  Ears:  Canals normal.  Tympanic membranes normal.  Nose:  Mildly congested turbinates.  No sinus tenderness.   Pharynx:  Mildly erythematous Neck:  Supple.  Tender right lateral nodes.  Mildly enlarged tonsillar nodes, tender on the right. Lungs:  Clear to auscultation.  Breath sounds are equal.  Moving air well. Heart:  Regular rate and rhythm without murmurs, rubs, or gallops.  Abdomen:  Nontender without masses or hepatosplenomegaly.  Bowel sounds are present.  No CVA or flank tenderness.  Extremities:  No edema.  Skin:  No rash present.   UC Treatments / Results  Labs (all labs ordered are listed, but only abnormal results are displayed) Labs Reviewed  STREP A DNA PROBE  NOVEL CORONAVIRUS, NAA  POCT RAPID STREP A (OFFICE) negative    EKG   Radiology No results found.  Procedures Procedures (including critical care time)  Medications Ordered in UC Medications - No data to display  Initial  Impression / Assessment and Plan / UC Course  I have reviewed the triage vital signs and the nursing notes.  Pertinent labs & imaging results  that were available during my care of the patient were reviewed by me and considered in my medical decision making (see chart for details).    Suspect viral URI.  Throat culture pending. COVID19 send out. Begin prednisone burst/taper.  Rx for licocaine viscous for sore throat. Followup with Family Doctor if not improved in 7 to 10 days.   Final Clinical Impressions(s) / UC Diagnoses   Final diagnoses:  Pharyngitis, unspecified etiology     Discharge Instructions     If cold-like symptoms develop, try the following: Take plain guaifenesin (1200mg  extended release tabs such as Mucinex) twice daily, with plenty of water, for cough and congestion.  May add Pseudoephedrine (30mg , one or two every 4 to 6 hours) for sinus congestion.  Get adequate rest.   May use Afrin nasal spray (or generic oxymetazoline) each morning for about 5 days and then discontinue.  Also recommend using saline nasal spray several times daily and saline nasal irrigation (AYR is a common brand).  Use Flonase nasal spray each morning after using Afrin nasal spray and saline nasal irrigation. Try warm salt water gargles for sore throat.  Stop all antihistamines for now, and other non-prescription cough/cold preparations. May take Tylenol as needed for pain. May take Delsym Cough Suppressant at bedtime for nighttime cough.   Isolate yourself until COVID-19 test result is available.   If your COVID19 test is positive, then you are infected with the novel coronavirus and could give the virus to others.  Please continue isolation at home for at least 10 days since the start of your symptoms.   Once you complete your 10 day quarantine, you may return to normal activities as long as you've not had a fever for over 24 hours (without taking fever reducing medicine) and your symptoms are  improving. Please continue good preventive care measures, including:  frequent hand-washing, avoid touching your face, cover coughs/sneezes, stay out of crowds and keep a 6 foot distance from others.  Go to the nearest hospital emergency room if fever/cough/breathlessness are severe or illness seems like a threat to life.     ED Prescriptions    Medication Sig Dispense Auth. Provider   predniSONE (DELTASONE) 20 MG tablet Take one tab by mouth twice daily for 4 days, then one daily for 3 days. Take with food. 11 tablet Kandra Nicolas, MD   lidocaine (XYLOCAINE) 2 % solution Use as directed 15 mLs in the mouth or throat 4 (four) times daily -  before meals and at bedtime. Gargle, then expectorate 200 mL Kandra Nicolas, MD        Kandra Nicolas, MD 01/04/20 [phone]

## 2020-01-04 NOTE — ED Triage Notes (Signed)
Pt c/o sore throat, cough, and bilateral ear pain x 2 days; and lymph nodes x 4 days.

## 2020-01-05 LAB — STREP A DNA PROBE: Group A Strep Probe: NOT DETECTED

## 2020-01-06 LAB — NOVEL CORONAVIRUS, NAA: SARS-CoV-2, NAA: NOT DETECTED

## 2020-01-12 ENCOUNTER — Telehealth: Payer: Managed Care, Other (non HMO) | Admitting: Physician Assistant

## 2020-01-12 DIAGNOSIS — Z308 Encounter for other contraceptive management: Secondary | ICD-10-CM

## 2020-01-12 DIAGNOSIS — N939 Abnormal uterine and vaginal bleeding, unspecified: Secondary | ICD-10-CM

## 2020-01-12 NOTE — Progress Notes (Signed)
Based on what you shared with me, I feel your condition warrants further evaluation and I recommend that you be seen for a face to face visit.  Unfortunately, there are limitations to the types of visits that can be completed through the [facility] and I am unable to complete this encounter without a face to face evaluation.   Please contact your primary care physician practice or whoever currently prescribes your birth control to be seen. Many offices offer virtual options to be seen via video if you are not comfortable going in person to a medical facility at this time.  If you do not have a PCP, [facility] offers a free physician referral service available at [phone]. Our trained staff has the experience, knowledge and resources to put you in touch with a physician who is right for you.   You also have the option of a video visit through [email or web address]  If you are having a true medical emergency please call 911.  NOTE: If you entered your credit card information for this eVisit, you will not be charged. You may see a "hold" on your card for the $35 but that hold will drop off and you will not have a charge processed.  Your e-visit answers were reviewed by a board certified advanced clinical practitioner to complete your personal care plan.  Thank you for using e-Visits.  Approximately 5 minutes was spent documenting and reviewing patient's chart.

## 2020-01-17 ENCOUNTER — Ambulatory Visit (INDEPENDENT_AMBULATORY_CARE_PROVIDER_SITE_OTHER): Payer: Managed Care, Other (non HMO) | Admitting: Medical

## 2020-01-17 ENCOUNTER — Encounter: Payer: Self-pay | Admitting: Medical

## 2020-01-17 ENCOUNTER — Other Ambulatory Visit: Payer: Self-pay

## 2020-01-17 VITALS — BP 117/84 | HR 77 | Ht 63.0 in | Wt 164.0 lb

## 2020-01-17 DIAGNOSIS — Z3202 Encounter for pregnancy test, result negative: Secondary | ICD-10-CM | POA: Diagnosis not present

## 2020-01-17 DIAGNOSIS — Z30017 Encounter for initial prescription of implantable subdermal contraceptive: Secondary | ICD-10-CM | POA: Diagnosis not present

## 2020-01-17 LAB — POCT URINE PREGNANCY: Preg Test, Ur: NEGATIVE

## 2020-01-17 MED ORDER — ETONOGESTREL 68 MG ~~LOC~~ IMPL
68.0000 mg | DRUG_IMPLANT | Freq: Once | SUBCUTANEOUS | Status: AC
  Administered 2020-01-17: 11:00:00 68 mg via SUBCUTANEOUS

## 2020-01-17 NOTE — Progress Notes (Signed)
GYNECOLOGY CLINIC PROCEDURE NOTE  Ms. Katrina Fuller is a 24 y.o. [phone] here for Nexplanon insertion. No GYN concerns. Patient is currently on OCPs and having breakthrough bleeding. Had Nexplanon in the past and did not have periods.   Nexplanon Insertion Procedure Patient was given informed consent, she signed consent form.  Patient does understand that irregular bleeding is a very common side effect of this medication. She was advised to have backup contraception for one week after placement. Pregnancy test in clinic today was negative.  Appropriate time out taken.  Patient's left arm was prepped and draped in the usual sterile fashion. The arm was measured and marked for apprpriate insertion area.  Patient was prepped with alcohol swab and then injected with 3 ml of 1% lidocaine.  She was prepped with betadine, Nexplanon removed from packaging,  Device confirmed in needle, then inserted full length of needle and withdrawn per handbook instructions. Nexplanon was able to palpated in the patient's arm; patient palpated the insert herself. There was minimal blood loss.  Patient insertion site covered with guaze and a pressure bandage to reduce any bruising.  The patient tolerated the procedure well and was given post procedure instructions.   Patient advised to complete pack of OCPs she is currently taking and then discontinue use Follow-up for next annual exam or sooner PRN  Kathlene Cote 01/17/2020 11:23 AM

## 2020-01-17 NOTE — Patient Instructions (Signed)
Etonogestrel implant What is this medicine? ETONOGESTREL (et oh noe JES trel) is a contraceptive (birth control) device. It is used to prevent pregnancy. It can be used for up to 3 years. This medicine may be used for other purposes; ask your health care provider or pharmacist if you have questions. COMMON BRAND NAME(S): Implanon, Nexplanon What should I tell my health care provider before I take this medicine? They need to know if you have any of these conditions:  abnormal vaginal bleeding  blood vessel disease or blood clots  breast, cervical, endometrial, ovarian, liver, or uterine cancer  diabetes  gallbladder disease  heart disease or recent heart attack  high blood pressure  high cholesterol or triglycerides  kidney disease  liver disease  migraine headaches  seizures  stroke  tobacco smoker  an unusual or allergic reaction to etonogestrel, anesthetics or antiseptics, other medicines, foods, dyes, or preservatives  pregnant or trying to get pregnant  breast-feeding How should I use this medicine? This device is inserted just under the skin on the inner side of your upper arm by a health care professional. Talk to your pediatrician regarding the use of this medicine in children. Special care may be needed. Overdosage: If you think you have taken too much of this medicine contact a poison control center or emergency room at once. NOTE: This medicine is only for you. Do not share this medicine with others. What if I miss a dose? This does not apply. What may interact with this medicine? Do not take this medicine with any of the following medications:  amprenavir  fosamprenavir This medicine may also interact with the following medications:  acitretin  aprepitant  armodafinil  bexarotene  bosentan  carbamazepine  certain medicines for fungal infections like fluconazole, ketoconazole, itraconazole and voriconazole  certain medicines to treat  hepatitis, HIV or AIDS  cyclosporine  felbamate  griseofulvin  lamotrigine  modafinil  oxcarbazepine  phenobarbital  phenytoin  primidone  rifabutin  rifampin  rifapentine  St. John's wort  topiramate This list may not describe all possible interactions. Give your health care provider a list of all the medicines, herbs, non-prescription drugs, or dietary supplements you use. Also tell them if you smoke, drink alcohol, or use illegal drugs. Some items may interact with your medicine. What should I watch for while using this medicine? This product does not protect you against HIV infection (AIDS) or other sexually transmitted diseases. You should be able to feel the implant by pressing your fingertips over the skin where it was inserted. Contact your doctor if you cannot feel the implant, and use a non-hormonal birth control method (such as condoms) until your doctor confirms that the implant is in place. Contact your doctor if you think that the implant may have broken or become bent while in your arm. You will receive a user card from your health care provider after the implant is inserted. The card is a record of the location of the implant in your upper arm and when it should be removed. Keep this card with your health records. What side effects may I notice from receiving this medicine? Side effects that you should report to your doctor or health care professional as soon as possible:  allergic reactions like skin rash, itching or hives, swelling of the face, lips, or tongue  breast lumps, breast tissue changes, or discharge  breathing problems  changes in emotions or moods  coughing up blood  if you feel that the implant   may have broken or bent while in your arm  high blood pressure  pain, irritation, swelling, or bruising at the insertion site  scar at site of insertion  signs of infection at the insertion site such as fever, and skin redness, pain or  discharge  signs and symptoms of a blood clot such as breathing problems; changes in vision; chest pain; severe, sudden headache; pain, swelling, warmth in the leg; trouble speaking; sudden numbness or weakness of the face, arm or leg  signs and symptoms of liver injury like dark yellow or brown urine; general ill feeling or flu-like symptoms; light-colored stools; loss of appetite; nausea; right upper belly pain; unusually weak or tired; yellowing of the eyes or skin  unusual vaginal bleeding, discharge Side effects that usually do not require medical attention (report to your doctor or health care professional if they continue or are bothersome):  acne  breast pain or tenderness  headache  irregular menstrual bleeding  nausea This list may not describe all possible side effects. Call your doctor for medical advice about side effects. You may report side effects to FDA at 1-800-FDA-1088. Where should I keep my medicine? This drug is given in a hospital or clinic and will not be stored at home. NOTE: This sheet is a summary. It may not cover all possible information. If you have questions about this medicine, talk to your doctor, pharmacist, or health care provider.  2020 Elsevier/Gold Standard (2019-08-30 11:33:04)    Nexplanon Instructions After Insertion   Keep bandage clean and dry for 24 hours   May use ice/Tylenol/Ibuprofen for soreness or pain   If you develop fever, drainage or increased warmth from incision site-contact office immediately   

## 2020-03-14 ENCOUNTER — Emergency Department
Admission: EM | Admit: 2020-03-14 | Discharge: 2020-03-14 | Disposition: A | Discharge status: Home / Self Care | Payer: Managed Care, Other (non HMO) | Source: Home / Self Care | Attending: Family Medicine | Admitting: Family Medicine

## 2020-03-14 DIAGNOSIS — R110 Nausea: Secondary | ICD-10-CM

## 2020-03-14 DIAGNOSIS — R519 Headache, unspecified: Secondary | ICD-10-CM

## 2020-03-14 MED ORDER — ONDANSETRON 4 MG PO TBDP
4.0000 mg | ORAL_TABLET | Freq: Once | ORAL | Status: AC
  Administered 2020-03-14: 10:00:00 4 mg via ORAL

## 2020-03-14 MED ORDER — ONDANSETRON 4 MG PO TBDP: ORAL_TABLET | ORAL | 0 refills | Status: DC

## 2020-03-14 NOTE — ED Triage Notes (Signed)
Patient presents to Urgent Care with complaints of headache and fever since yesterday. Patient reports she got her second covid vaccine two days ago. Pt states she also has some SOB and generalized body aches.

## 2020-03-14 NOTE — ED Provider Notes (Signed)
[facility]    CSN: [id number] Arrival date & time: 03/14/20  0831      History   Chief Complaint Chief Complaint  Patient presents with  . Headache    HPI Katrina Fuller is a 24 y.o. female.   Patient received her second COVID19 vaccine two days ago.  Yesterday she awoke with fatigue, myalgias, low grade fever, nausea (without vomiting),  migraine headache, and mild tightness in her anterior chest without cough or shortness of breath.  The history is provided by the patient.    History reviewed. No pertinent past medical history.  Patient Active Problem List   Diagnosis Date Noted  . S/P cesarean section 10/23/2017  . Cellulitis and abscess of leg 10/07/2017  . Anemia affecting pregnancy, antepartum 08/27/2017  . History of diet controlled gestational diabetes mellitus (GDM) 04/15/2017  . Family history of breast cancer 03/20/2017  . Rh negative state in antepartum period 03/19/2017  . History of gestational hypertension 08/01/2016  . Fourth degree laceration of perineum during delivery, postpartum 07/31/2016  . Postpartum episiotomy dehiscence 07/31/2016  . Nonintractable episodic headache 01/30/2016    Past Surgical History:  Procedure Laterality Date  . CESAREAN SECTION N/A 10/23/2017   Procedure: PRIMARY CESAREAN SECTION;  Surgeon: Mora Bellman, MD;  Location: [place];  Service: Obstetrics;  Laterality: N/A;  . chin surgery     5 surgeries for cyst on chin    OB History    Gravida  2   Para  2   Term  2   Preterm      AB      Living  2     SAB      TAB      Ectopic      Multiple      Live Births  2        Obstetric Comments  4th degree laceration with first delivery         Home Medications    Prior to Admission medications   Medication Sig Start Date End Date Taking? Authorizing Provider  Ferrous Sulfate (IRON PO) Take by mouth.    [provider]  ipratropium (ATROVENT) 0.06 % nasal spray  Place 2 sprays into both nostrils 4 (four) times daily. Patient not taking: Reported on 01/17/2020 01/17/19   Noe Gens, PA-C  Multiple Vitamin (MULTIVITAMIN) tablet Take 1 tablet by mouth daily.    [provider]  ondansetron (ZOFRAN ODT) 4 MG disintegrating tablet Take one tab by mouth Q6hr prn nausea.  Dissolve under tongue. 03/14/20   Kandra Nicolas, MD    Family History Family History  Problem Relation Age of Onset  . Diabetes Mother   . Hypertension Mother   . Diverticulitis Mother   . Multiple sclerosis Father   . Diabetes Father   . Hypertension Father   . Diverticulitis Father   . Breast cancer Sister   . Breast cancer Paternal Grandmother   . Breast cancer Paternal Aunt        4 P aunts with breast cancer    Social History Social History   Tobacco Use  . Smoking status: Current Every Day Smoker    Packs/day: 0.25    Types: Cigarettes  . Smokeless tobacco: Never Used  Substance Use Topics  . Alcohol use: No  . Drug use: No     Allergies   Latex   Review of Systems Review of Systems No sore throat No cough No pleuritic  pain No wheezing No nasal congestion No post-nasal drainage No sinus pain/pressure No itchy/red eyes No earache No hemoptysis No SOB No fever/chills + nausea No vomiting No abdominal pain No diarrhea No urinary symptoms No skin rash + fatigue + myalgias + headache   Physical Exam Triage Vital Signs ED Triage Vitals  Enc Vitals Group     BP 03/14/20 0842 114/80     Pulse Rate 03/14/20 0842 89     Resp 03/14/20 0842 18     Temp 03/14/20 0842 99 F (37.2 C)     Temp Source 03/14/20 0842 Oral     SpO2 03/14/20 0842 100 %     Weight --      Height --      Head Circumference --      Peak Flow --      Pain Score 03/14/20 0840 8     Pain Loc --      Pain Edu? --      Excl. in GC? --    No data found.  Updated Vital Signs BP 114/80 (BP Location: Right Arm)   Pulse 89   Temp 99 F (37.2 C) (Oral)    Resp 18   SpO2 100%   Visual Acuity Right Eye Distance:   Left Eye Distance:   Bilateral Distance:    Right Eye Near:   Left Eye Near:    Bilateral Near:     Physical Exam Nursing notes and Vital Signs reviewed. Appearance:  Patient appears stated age, and in no acute distress Eyes:  Pupils are equal, round, and reactive to light and accomodation.  Extraocular movement is intact.  Conjunctivae are not inflamed  Ears:  Canals normal.  Tympanic membranes normal.  Nose:   Normal turbinates.  No sinus tenderness.    Pharynx:  Normal Neck:  Supple.  Tender shotty lateral nodes. Lungs:  Clear to auscultation.  Breath sounds are equal.  Moving air well. Heart:  Regular rate and rhythm without murmurs, rubs, or gallops.  Abdomen:  Nontender without masses or hepatosplenomegaly.  Bowel sounds are present.  No CVA or flank tenderness.  Extremities:  No edema or leg tenderness. Skin:  No rash present.   UC Treatments / Results  Labs (all labs ordered are listed, but only abnormal results are displayed) Labs Reviewed - No data to display  EKG   Radiology No results found.  Procedures Procedures (including critical care time)  Medications Ordered in UC Medications  ondansetron (ZOFRAN-ODT) disintegrating tablet 4 mg (4 mg Oral Given 03/14/20 0936)    Initial Impression / Assessment and Plan / UC Course  I have reviewed the triage vital signs and the nursing notes.  Pertinent labs & imaging results that were available during my care of the patient were reviewed by me and considered in my medical decision making (see chart for details).    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  Reassurance. Administered Zofran ODT 4mg  PO; given Rx for same. Followup with Family Doctor if not improved in one week.    Final Clinical Impressions(s) / UC Diagnoses   Final diagnoses:  Nausea without vomiting  Acute nonintractable headache, unspecified headache type     Discharge Instructions       Rest, increase fluid intake.  May continue Tylenol as needed for headache, body aches, etc.    ED Prescriptions    Medication Sig Dispense Auth. Provider   ondansetron (ZOFRAN ODT) 4 MG disintegrating tablet Take one tab by mouth Q6hr prn  nausea.  Dissolve under tongue. 12 tablet Lattie Haw, MD        Lattie Haw, MD 03/18/20 [phone]

## 2020-03-14 NOTE — Discharge Instructions (Signed)
Rest, increase fluid intake.  May continue Tylenol as needed for headache, body aches, etc.

## 2020-03-16 ENCOUNTER — Emergency Department (INDEPENDENT_AMBULATORY_CARE_PROVIDER_SITE_OTHER)
Admission: EM | Admit: 2020-03-16 | Discharge: 2020-03-16 | Disposition: A | Discharge status: Home / Self Care | Payer: Managed Care, Other (non HMO) | Source: Home / Self Care

## 2020-03-16 ENCOUNTER — Other Ambulatory Visit: Payer: Self-pay

## 2020-03-16 DIAGNOSIS — R21 Rash and other nonspecific skin eruption: Secondary | ICD-10-CM

## 2020-03-16 DIAGNOSIS — R0789 Other chest pain: Secondary | ICD-10-CM

## 2020-03-16 DIAGNOSIS — R52 Pain, unspecified: Secondary | ICD-10-CM

## 2020-03-16 LAB — POCT CBC W AUTO DIFF (K'VILLE URGENT CARE)

## 2020-03-16 NOTE — ED Triage Notes (Signed)
Patient presents to Urgent Care with complaints of painful rash on her right upper arm since early yesterday. Patient reports that is the arm where she got her covid vaccine. Would like a note for work as well.

## 2020-03-16 NOTE — Discharge Instructions (Signed)
  It is strongly encouraged to establish care with a primary care provider for ongoing healthcare needs including annual physicals and recheck of today's symptoms sometime next week if not improving.  Currently the blood clots from Covid-19 are still being researched. The most recent information provided by the CDC are related to just the [place] and [place] vaccine with 6 women having blood clots out of close to 7 million people receiving the vaccine.  Your vital signs and blood work are reassuring today.  However, if you develop a severe headache, worsening chest pain, trouble breathing,  easy bruising, or other new concerning symptoms develop, call 911 or go to the hospital for further evaluation and treatment.

## 2020-03-16 NOTE — ED Provider Notes (Signed)
[facility]    CSN: [id number] Arrival date & time: 03/16/20  0840      History   Chief Complaint Chief Complaint  Patient presents with  . Rash  . Chest Pain    HPI Katrina Fuller is a 24 y.o. female.   HPI  Katrina Fuller is a 24 y.o. female presenting to UC with c/o sore red rash on her Right upper arm where she got her second Covid-19 vaccine 5 days ago.  She was seen at [facility] two days ago with c/o headache, fever, and nausea that started the day after her second vaccine.  Those symptoms have resolved but she still has body aches and some SOB with chest pain.  Denies fever, chills, n/v/d at this time. She has been taking ibuprofen with mild relief. Pt states her mother encouraged her to come be evaluated for a possible blood clot given recent reports in the media about blood clots with the single dose [employer] Covid-19 vaccine. Pt denies any hx of clots. Denies leg pain or HA today.   History reviewed. No pertinent past medical history.    Patient Active Problem List   Diagnosis Date Noted  . S/P cesarean section 10/23/2017  . Cellulitis and abscess of leg 10/07/2017  . Anemia affecting pregnancy, antepartum 08/27/2017  . History of diet controlled gestational diabetes mellitus (GDM) 04/15/2017  . Family history of breast cancer 03/20/2017  . Rh negative state in antepartum period 03/19/2017  . History of gestational hypertension 08/01/2016  . Fourth degree laceration of perineum during delivery, postpartum 07/31/2016  . Postpartum episiotomy dehiscence 07/31/2016  . Nonintractable episodic headache 01/30/2016    Past Surgical History:  Procedure Laterality Date  . CESAREAN SECTION N/A 10/23/2017   Procedure: PRIMARY CESAREAN SECTION;  Surgeon: Mora Bellman, MD;  Location: [place];  Service: Obstetrics;  Laterality: N/A;  . chin surgery     5 surgeries for cyst on chin    OB History    Gravida  2   Para  2   Term  2   Preterm      AB      Living  2     SAB      TAB      Ectopic      Multiple      Live Births  2        Obstetric Comments  4th degree laceration with first delivery         Home Medications    Prior to Admission medications   Medication Sig Start Date End Date Taking? Authorizing Provider  ondansetron (ZOFRAN ODT) 4 MG disintegrating tablet Take one tab by mouth Q6hr prn nausea.  Dissolve under tongue. 03/14/20  Yes Kandra Nicolas, MD  Ferrous Sulfate (IRON PO) Take by mouth.    [provider]  ipratropium (ATROVENT) 0.06 % nasal spray Place 2 sprays into both nostrils 4 (four) times daily. Patient not taking: Reported on 01/17/2020 01/17/19   Noe Gens, PA-C  Multiple Vitamin (MULTIVITAMIN) tablet Take 1 tablet by mouth daily.    [provider]    Family History Family History  Problem Relation Age of Onset  . Diabetes Mother   . Hypertension Mother   . Diverticulitis Mother   . Multiple sclerosis Father   . Diabetes Father   . Hypertension Father   . Diverticulitis Father   . Breast cancer Sister   . Breast cancer Paternal Grandmother   .  Breast cancer Paternal Aunt        4 P aunts with breast cancer    Social History Social History   Tobacco Use  . Smoking status: Current Every Day Smoker    Packs/day: 0.25    Types: Cigarettes  . Smokeless tobacco: Never Used  Substance Use Topics  . Alcohol use: No  . Drug use: No     Allergies   Latex   Review of Systems Review of Systems  Constitutional: Negative for appetite change, chills, diaphoresis, fatigue and fever.  HENT: Positive for congestion (minimal). Negative for ear pain, sinus pressure, sinus pain and sore throat.   Respiratory: Positive for cough (mild), chest tightness and shortness of breath.   Cardiovascular: Positive for chest pain. Negative for palpitations and leg swelling.  Gastrointestinal: Negative for abdominal pain, diarrhea, nausea and vomiting.    Musculoskeletal: Positive for arthralgias, back pain and myalgias.       Mild body aches  Skin: Positive for rash. Negative for wound.  Neurological: Negative for dizziness, light-headedness and headaches.     Physical Exam Triage Vital Signs ED Triage Vitals  Enc Vitals Group     BP 03/16/20 0856 111/75     Pulse Rate 03/16/20 0856 86     Resp 03/16/20 0856 18     Temp 03/16/20 0856 98.2 F (36.8 C)     Temp Source 03/16/20 0856 Oral     SpO2 03/16/20 0911 99 %     Weight --      Height --      Head Circumference --      Peak Flow --      Pain Score 03/16/20 0855 8     Pain Loc --      Pain Edu? --      Excl. in GC? --    No data found.  Updated Vital Signs BP 111/75 (BP Location: Right Arm)   Pulse 86   Temp 98.2 F (36.8 C) (Oral)   Resp 18   SpO2 99%    Ambulatory O2 Sat never dropped below 98% in UC, HR up to 99    Physical Exam Vitals and nursing note reviewed.  Constitutional:      General: She is not in acute distress.    Appearance: She is well-developed. She is not ill-appearing, toxic-appearing or diaphoretic.  HENT:     Head: Normocephalic and atraumatic.     Right Ear: Tympanic membrane and ear canal normal.     Left Ear: Tympanic membrane and ear canal normal.     Nose: Nose normal.     Right Sinus: No maxillary sinus tenderness or frontal sinus tenderness.     Left Sinus: No maxillary sinus tenderness or frontal sinus tenderness.     Mouth/Throat:     Lips: Pink.     Mouth: Mucous membranes are moist.     Pharynx: Oropharynx is clear. Uvula midline.  Cardiovascular:     Rate and Rhythm: Normal rate and regular rhythm.  Pulmonary:     Effort: Pulmonary effort is normal.     Breath sounds: No decreased breath sounds, wheezing, rhonchi or rales.  Chest:    Musculoskeletal:        General: Normal range of motion.     Cervical back: Normal range of motion.  Skin:    General: Skin is warm and dry.       Neurological:     Mental Status:  She is alert and  oriented to person, place, and time.  Psychiatric:        Behavior: Behavior normal.      UC Treatments / Results  Labs (all labs ordered are listed, but only abnormal results are displayed) Labs Reviewed  POCT CBC W AUTO DIFF ([facility])    EKG   Radiology No results found.  Procedures Procedures (including critical care time)  Medications Ordered in UC Medications - No data to display  Initial Impression / Assessment and Plan / UC Course  I have reviewed the triage vital signs and the nursing notes.  Pertinent labs & imaging results that were available during my care of the patient were reviewed by me and considered in my medical decision making (see chart for details).     Pt c/o Right on Right upper arm where she received 2nd Covid vaccine. Exam c/w local reaction to vaccine. No evidence of bacterial infection at this time. Encouraged use of cool compresses.   CP atypical for ACS  CBC: unremarkable, normal WBC, Hgb/Hct and normal platelets HR and O2 Sat on RA- WNL at rest and ambulatory. Doubt PE at this time.   No indication for further workup at this time. Provided reassurance. Encouraged fluids, rest, tylenol and motrin. F/u with PCP next week if not improving over the weekend.  Discussed symptoms that warrant emergent care in the ED.   Final Clinical Impressions(s) / UC Diagnoses   Final diagnoses:  Other chest pain  Body aches  Rash     Discharge Instructions      It is strongly encouraged to establish care with a primary care provider for ongoing healthcare needs including annual physicals and recheck of today's symptoms sometime next week if not improving.  Currently the blood clots from Covid-19 are still being researched. The most recent information provided by the CDC are related to just the [place] and [place] vaccine with 6 women having blood clots out of close to 7 million people receiving the vaccine.  Your  vital signs and blood work are reassuring today.  However, if you develop a severe headache, worsening chest pain, trouble breathing,  easy bruising, or other new concerning symptoms develop, call 911 or go to the hospital for further evaluation and treatment.     ED Prescriptions    None     PDMP not reviewed this encounter.   Lurene Shadow, PA-C 03/16/20 1046

## 2020-06-04 ENCOUNTER — Emergency Department (INDEPENDENT_AMBULATORY_CARE_PROVIDER_SITE_OTHER)
Admission: EM | Admit: 2020-06-04 | Discharge: 2020-06-04 | Disposition: A | Discharge status: Home / Self Care | Payer: Managed Care, Other (non HMO) | Source: Home / Self Care

## 2020-06-04 ENCOUNTER — Other Ambulatory Visit: Payer: Self-pay

## 2020-06-04 DIAGNOSIS — H6691 Otitis media, unspecified, right ear: Secondary | ICD-10-CM

## 2020-06-04 DIAGNOSIS — J209 Acute bronchitis, unspecified: Secondary | ICD-10-CM

## 2020-06-04 DIAGNOSIS — F17210 Nicotine dependence, cigarettes, uncomplicated: Secondary | ICD-10-CM

## 2020-06-04 MED ORDER — AMOXICILLIN-POT CLAVULANATE 875-125 MG PO TABS: 1.0000 | ORAL_TABLET | Freq: Two times a day (BID) | ORAL | 0 refills | Status: DC

## 2020-06-04 MED ORDER — ALBUTEROL SULFATE HFA 108 (90 BASE) MCG/ACT IN AERS: 1.0000 | INHALATION_SPRAY | Freq: Four times a day (QID) | RESPIRATORY_TRACT | 0 refills | Status: DC | PRN

## 2020-06-04 NOTE — Discharge Instructions (Signed)

## 2020-06-04 NOTE — ED Provider Notes (Signed)
[facility]    CSN: [id number] Arrival date & time: 06/04/20  1203      History   Chief Complaint Chief Complaint  Patient presents with  . Cough  . Nasal Congestion    HPI Katrina Fuller is a 24 y.o. female.   HPI  Katrina Fuller is a 24 y.o. female presenting to UC with c/o 3-4 days of sinus congestion, cough, and now Right ear pain that started last night. Ear pain is most bothersome, aching pressure, 8/10.  She has taken mucinex without relief. No pain medication PTA.  Denies fever, chills, n/v/d. No sick contacts. She has been vaccinated against Covid. No hx of asthma but has needed an inhaler when sick when she was younger. Pt does smoke cigarettes.    History reviewed. No pertinent past medical history.  Patient Active Problem List   Diagnosis Date Noted  . S/P cesarean section 10/23/2017  . Cellulitis and abscess of leg 10/07/2017  . Anemia affecting pregnancy, antepartum 08/27/2017  . History of diet controlled gestational diabetes mellitus (GDM) 04/15/2017  . Family history of breast cancer 03/20/2017  . Rh negative state in antepartum period 03/19/2017  . History of gestational hypertension 08/01/2016  . Fourth degree laceration of perineum during delivery, postpartum 07/31/2016  . Postpartum episiotomy dehiscence 07/31/2016  . Nonintractable episodic headache 01/30/2016    Past Surgical History:  Procedure Laterality Date  . CESAREAN SECTION N/A 10/23/2017   Procedure: PRIMARY CESAREAN SECTION;  Surgeon: Catalina Antigua, MD;  [facility]: [facility];  Service: Obstetrics;  Laterality: N/A;  . chin surgery     5 surgeries for cyst on chin    OB History    Gravida  2   Para  2   Term  2   Preterm      AB      Living  2     SAB      TAB      Ectopic      Multiple      Live Births  2        Obstetric Comments  4th degree laceration with first delivery         Home Medications    Prior to Admission medications    Medication Sig Start Date End Date Taking? Authorizing Provider  albuterol (VENTOLIN HFA) 108 (90 Base) MCG/ACT inhaler Inhale 1-2 puffs into the lungs every 6 (six) hours as needed for wheezing or shortness of breath. 06/04/20   Lurene Shadow, PA-C  amoxicillin-clavulanate (AUGMENTIN) 875-125 MG tablet Take 1 tablet by mouth 2 (two) times daily. One po bid x 7 days 06/04/20   Lurene Shadow, PA-C  Ferrous Sulfate (IRON PO) Take by mouth.    [provider]  ipratropium (ATROVENT) 0.06 % nasal spray Place 2 sprays into both nostrils 4 (four) times daily. Patient not taking: Reported on 01/17/2020 01/17/19   Lurene Shadow, PA-C  Multiple Vitamin (MULTIVITAMIN) tablet Take 1 tablet by mouth daily.    [provider]  ondansetron (ZOFRAN ODT) 4 MG disintegrating tablet Take one tab by mouth Q6hr prn nausea.  Dissolve under tongue. 03/14/20   Lattie Haw, MD    Family History Family History  Problem Relation Age of Onset  . Diabetes Mother   . Hypertension Mother   . Diverticulitis Mother   . Multiple sclerosis Father   . Diabetes Father   . Hypertension Father   . Diverticulitis Father   . Breast  cancer Sister   . Breast cancer Paternal Grandmother   . Breast cancer Paternal Aunt        4 P aunts with breast cancer    Social History Social History   Tobacco Use  . Smoking status: Current Every Day Smoker    Packs/day: 0.50    Types: Cigarettes  . Smokeless tobacco: Never Used  Vaping Use  . Vaping Use: Former  Substance Use Topics  . Alcohol use: No  . Drug use: No     Allergies   Latex   Review of Systems Review of Systems  Constitutional: Negative for chills and fever.  HENT: Positive for congestion and ear pain (Right). Negative for sore throat, trouble swallowing and voice change.   Respiratory: Positive for cough and wheezing. Negative for shortness of breath.   Cardiovascular: Negative for chest pain and palpitations.  Gastrointestinal:  Negative for abdominal pain, diarrhea, nausea and vomiting.  Musculoskeletal: Negative for arthralgias, back pain and myalgias.  Skin: Negative for rash.  Neurological: Positive for headaches. Negative for dizziness and light-headedness.  All other systems reviewed and are negative.    Physical Exam Triage Vital Signs ED Triage Vitals  Enc Vitals Group     BP 06/04/20 1216 109/77     Pulse Rate 06/04/20 1216 77     Resp 06/04/20 1216 16     Temp 06/04/20 1216 98.3 F (36.8 C)     Temp Source 06/04/20 1216 Oral     SpO2 06/04/20 1216 99 %     Weight --      Height --      Head Circumference --      Peak Flow --      Pain Score 06/04/20 1215 8     Pain Loc --      Pain Edu? --      Excl. in GC? --    No data found.  Updated Vital Signs BP 109/77 (BP Location: Right Arm)   Pulse 77   Temp 98.3 F (36.8 C) (Oral)   Resp 16   SpO2 99%   Visual Acuity Right Eye Distance:   Left Eye Distance:   Bilateral Distance:    Right Eye Near:   Left Eye Near:    Bilateral Near:     Physical Exam Vitals and nursing note reviewed.  Constitutional:      General: She is not in acute distress.    Appearance: Normal appearance. She is well-developed. She is not ill-appearing, toxic-appearing or diaphoretic.  HENT:     Head: Normocephalic and atraumatic.     Right Ear: Tympanic membrane is erythematous and bulging.     Left Ear: Tympanic membrane and ear canal normal.     Nose: Nose normal.     Right Sinus: No maxillary sinus tenderness or frontal sinus tenderness.     Left Sinus: No maxillary sinus tenderness or frontal sinus tenderness.     Mouth/Throat:     Lips: Pink.     Mouth: Mucous membranes are moist.     Pharynx: Oropharynx is clear. Uvula midline.  Cardiovascular:     Rate and Rhythm: Normal rate and regular rhythm.  Pulmonary:     Effort: Pulmonary effort is normal. No respiratory distress.     Breath sounds: No stridor. Wheezing (faint, diffuse) and rhonchi  (mild, diffuse) present. No rales.  Musculoskeletal:        General: Normal range of motion.     Cervical back: Normal  range of motion.  Skin:    General: Skin is warm and dry.  Neurological:     Mental Status: She is alert and oriented to person, place, and time.  Psychiatric:        Behavior: Behavior normal.      UC Treatments / Results  Labs (all labs ordered are listed, but only abnormal results are displayed) Labs Reviewed - No data to display  EKG   Radiology No results found.  Procedures Procedures (including critical care time)  Medications Ordered in UC Medications - No data to display  Initial Impression / Assessment and Plan / UC Course  I have reviewed the triage vital signs and the nursing notes.  Pertinent labs & imaging results that were available during my care of the patient were reviewed by me and considered in my medical decision making (see chart for details).     Hx and exam c/w Right AOM with acute bronchitis Will start pt on Augmentin an albuterol inhaler (has done well in the past) F/u PCP in 1 week AVS given  Final Clinical Impressions(s) / UC Diagnoses   Final diagnoses:  Right acute otitis media  Acute bronchitis, unspecified organism  Cigarette smoker     Discharge Instructions     Please take antibiotics as prescribed and be sure to complete entire course even if you start to feel better to ensure infection does not come back.  You may take 500mg  acetaminophen every 4-6 hours or in combination with ibuprofen 400-600mg  every 6-8 hours as needed for pain, inflammation, and fever.  Be sure to well hydrated with clear liquids and get at least 8 hours of sleep at night, preferably more while sick.   Please follow up with family medicine in 1 week if needed.     ED Prescriptions    Medication Sig Dispense Auth. Provider   amoxicillin-clavulanate (AUGMENTIN) 875-125 MG tablet Take 1 tablet by mouth 2 (two) times daily. One po  bid x 7 days 14 tablet Colbey Wirtanen O, PA-C   albuterol (VENTOLIN HFA) 108 (90 Base) MCG/ACT inhaler Inhale 1-2 puffs into the lungs every 6 (six) hours as needed for wheezing or shortness of breath. 8 g 07[birth year]-1987, PA-C     PDMP not reviewed this encounter.   Lurene Shadow, Lurene Shadow 06/04/20 1314

## 2020-06-04 NOTE — ED Triage Notes (Signed)
Patient presents to Urgent Care with complaints of cough and nasal congestion since about 3-4 days ago. Patient reports she has been taking otc mucinex for her sx. Since last night, her right ear became very painful and feels like there is pressure behind it. Pt denies fever. Pt has been vaccinated for covid.

## 2020-07-18 ENCOUNTER — Other Ambulatory Visit: Payer: Self-pay

## 2020-07-18 ENCOUNTER — Encounter: Payer: Self-pay | Admitting: Emergency Medicine

## 2020-07-18 ENCOUNTER — Emergency Department (INDEPENDENT_AMBULATORY_CARE_PROVIDER_SITE_OTHER)
Admission: EM | Admit: 2020-07-18 | Discharge: 2020-07-18 | Disposition: A | Discharge status: Home / Self Care | Payer: Managed Care, Other (non HMO) | Source: Home / Self Care | Attending: Emergency Medicine | Admitting: Emergency Medicine

## 2020-07-18 DIAGNOSIS — R300 Dysuria: Secondary | ICD-10-CM | POA: Diagnosis not present

## 2020-07-18 DIAGNOSIS — N3001 Acute cystitis with hematuria: Secondary | ICD-10-CM

## 2020-07-18 LAB — POCT URINALYSIS DIP (MANUAL ENTRY)
Bilirubin, UA: NEGATIVE
Glucose, UA: NEGATIVE mg/dL
Ketones, POC UA: NEGATIVE mg/dL
Nitrite, UA: NEGATIVE
Protein Ur, POC: NEGATIVE mg/dL
Spec Grav, UA: 1.01 (ref 1.010–1.025)
Urobilinogen, UA: 0.2 E.U./dL
pH, UA: 6.5 (ref 5.0–8.0)

## 2020-07-18 MED ORDER — CEPHALEXIN 500 MG PO CAPS
500.0000 mg | ORAL_CAPSULE | Freq: Three times a day (TID) | ORAL | 0 refills | Status: DC
Start: 2020-07-18 — End: 2020-09-21

## 2020-07-18 NOTE — ED Provider Notes (Signed)
[facility]    CSN: [id number] Arrival date & time: 07/18/20  0817      History   Chief Complaint Chief Complaint  Patient presents with  . Dysuria    HPI Katrina Fuller is a 24 y.o. female.   HPI This is a 24 y.o. female who presents today with UTI symptoms for 3 days.  + dysuria + frequency + urgency No hematuria No vaginal discharge No fever/chills No lower abdominal pain No nausea No vomiting No back pain, except mild right flank pain. No fatigue She denies chance of pregnancy. Has tried over-the-counter measures without improvement.    History reviewed. No pertinent past medical history.  Patient Active Problem List   Diagnosis Date Noted  . S/P cesarean section 10/23/2017  . Cellulitis and abscess of leg 10/07/2017  . Anemia affecting pregnancy, antepartum 08/27/2017  . History of diet controlled gestational diabetes mellitus (GDM) 04/15/2017  . Family history of breast cancer 03/20/2017  . Rh negative state in antepartum period 03/19/2017  . History of gestational hypertension 08/01/2016  . Fourth degree laceration of perineum during delivery, postpartum 07/31/2016  . Postpartum episiotomy dehiscence 07/31/2016  . Nonintractable episodic headache 01/30/2016    Past Surgical History:  Procedure Laterality Date  . CESAREAN SECTION N/A 10/23/2017   Procedure: PRIMARY CESAREAN SECTION;  Surgeon: Catalina Antigua, MD;  Location: [facility];  Service: Obstetrics;  Laterality: N/A;  . chin surgery     5 surgeries for cyst on chin    OB History    Gravida  2   Para  2   Term  2   Preterm      AB      Living  2     SAB      TAB      Ectopic      Multiple      Live Births  2        Obstetric Comments  4th degree laceration with first delivery         Home Medications    Prior to Admission medications   Medication Sig Start Date End Date Taking? Authorizing Provider  Multiple Vitamin (MULTIVITAMIN) tablet  Take 1 tablet by mouth daily.   Yes [provider]  albuterol (VENTOLIN HFA) 108 (90 Base) MCG/ACT inhaler Inhale 1-2 puffs into the lungs every 6 (six) hours as needed for wheezing or shortness of breath. 06/04/20   Lurene Shadow, PA-C  cephALEXin (KEFLEX) 500 MG capsule Take 1 capsule (500 mg total) by mouth 3 (three) times daily. For 5 days 07/18/20   Lajean Manes, MD  Ferrous Sulfate (IRON PO) Take by mouth.    [provider]  ipratropium (ATROVENT) 0.06 % nasal spray Place 2 sprays into both nostrils 4 (four) times daily. Patient not taking: Reported on 01/17/2020 01/17/19   Lurene Shadow, PA-C  ondansetron (ZOFRAN ODT) 4 MG disintegrating tablet Take one tab by mouth Q6hr prn nausea.  Dissolve under tongue. 03/14/20   Lattie Haw, MD    Family History Family History  Problem Relation Age of Onset  . Diabetes Mother   . Hypertension Mother   . Diverticulitis Mother   . Multiple sclerosis Father   . Diabetes Father   . Hypertension Father   . Diverticulitis Father   . Breast cancer Sister   . Breast cancer Paternal Grandmother   . Breast cancer Paternal Aunt        4 P aunts with  breast cancer    Social History Social History   Tobacco Use  . Smoking status: Current Every Day Smoker    Packs/day: 0.50    Types: Cigarettes  . Smokeless tobacco: Never Used  Vaping Use  . Vaping Use: Former  Substance Use Topics  . Alcohol use: No  . Drug use: No     Allergies   Latex   Review of Systems Review of Systems   Physical Exam Triage Vital Signs ED Triage Vitals  Enc Vitals Group     BP 07/18/20 0830 122/82     Pulse Rate 07/18/20 0830 91     Resp 07/18/20 0830 15     Temp 07/18/20 0830 98.1 F (36.7 C)     Temp Source 07/18/20 0830 Oral     SpO2 07/18/20 0830 97 %     Weight --      Height --      Head Circumference --      Peak Flow --      Pain Score 07/18/20 0835 4     Pain Loc --      Pain Edu? --      Excl. in GC? --    No  data found.  Updated Vital Signs BP 122/82 (BP Location: Right Arm)   Pulse 91   Temp 98.1 F (36.7 C) (Oral)   Resp 15   SpO2 97%   Visual Acuity Right Eye Distance:   Left Eye Distance:   Bilateral Distance:    Right Eye Near:   Left Eye Near:    Bilateral Near:     Physical Exam Vitals and nursing note reviewed.  Constitutional:      General: She is not in acute distress.    Appearance: She is well-developed.  Eyes:     General: No scleral icterus. Cardiovascular:     Rate and Rhythm: Normal rate and regular rhythm.     Heart sounds: Normal heart sounds.  Pulmonary:     Breath sounds: Normal breath sounds.  Abdominal:     Palpations: Abdomen is soft. There is no mass.     Tenderness: There is abdominal tenderness (Mild) in the suprapubic area. There is no guarding or rebound.     Comments: No left or right CVA tenderness.  Minimal, nonreproducible right flank tenderness. No hepatosplenomegaly   Musculoskeletal:     Cervical back: Neck supple.  Lymphadenopathy:     Cervical: No cervical adenopathy.  Skin:    General: Skin is warm and dry.  Neurological:     Mental Status: She is alert and oriented to person, place, and time.      UC Treatments / Results  Labs (all labs ordered are listed, but only abnormal results are displayed) Labs Reviewed  URINE CULTURE - Abnormal; Notable for the following components:      Result Value   ISOLATE 1: Staphylococcus saprophyticus (*)    All other components within normal limits  POCT URINALYSIS DIP (MANUAL ENTRY) - Abnormal; Notable for the following components:   Blood, UA large (*)    Leukocytes, UA Moderate (2+) (*)    All other components within normal limits      Radiology No results found.  Procedures Procedures (including critical care time)  Medications Ordered in UC Medications - No data to display  Initial Impression / Assessment and Plan / UC Course  I have reviewed the triage vital signs and  the nursing notes.  Pertinent labs &  imaging results that were available during my care of the patient were reviewed by me and considered in my medical decision making (see chart for details).      Final Clinical Impressions(s) / UC Diagnoses   Final diagnoses:  Dysuria  Acute cystitis with hematuria  Urinalysis is positive for blood and leukocytes. Uncomplicated acute cystitis.  Risk benefits alternatives discussed.  She agrees with the following plans as described below.   Discharge Instructions     You have an uncomplicated urinary tract infection.  Please read attached instruction sheet on urinary tract infection. Prescription sent to your pharmacy for cephalexin, which is an antibiotic.  Take 3 times a day for 5 days. We are sending off a urine culture. Follow-up with your doctor if no better in 5 days, sooner if worse or new symptoms.    ED Prescriptions    Medication Sig Dispense Auth. Provider   cephALEXin (KEFLEX) 500 MG capsule Take 1 capsule (500 mg total) by mouth 3 (three) times daily. For 5 days 15 capsule Lajean Manes, MD     PDMP not reviewed this encounter.   Addendum: At time of urgent care visit, urine culture results were not back. At time of this dictation, urine culture results : staph saprophyticus.  Therefore, continue cephalexin.   Lajean Manes, MD 07/21/20 [phone]

## 2020-07-18 NOTE — Discharge Instructions (Signed)
You have an uncomplicated urinary tract infection.  Please read attached instruction sheet on urinary tract infection. Prescription sent to your pharmacy for cephalexin, which is an antibiotic.  Take 3 times a day for 5 days. We are sending off a urine culture. Follow-up with your doctor if no better in 5 days, sooner if worse or new symptoms.

## 2020-07-18 NOTE — ED Triage Notes (Signed)
Dysuria x 3 days - cranberry juice & H2O at home  Denies fever  No OTC pain meds today Bladder pressure w/ R side lower back pain COVID vaccinated

## 2020-07-20 LAB — URINE CULTURE
MICRO NUMBER:: 10841855
SPECIMEN QUALITY:: ADEQUATE

## 2020-09-21 ENCOUNTER — Other Ambulatory Visit: Payer: Self-pay

## 2020-09-21 ENCOUNTER — Emergency Department (INDEPENDENT_AMBULATORY_CARE_PROVIDER_SITE_OTHER)
Admission: EM | Admit: 2020-09-21 | Discharge: 2020-09-21 | Disposition: A | Discharge status: Home / Self Care | Payer: Managed Care, Other (non HMO) | Source: Home / Self Care

## 2020-09-21 DIAGNOSIS — J069 Acute upper respiratory infection, unspecified: Secondary | ICD-10-CM | POA: Diagnosis not present

## 2020-09-21 DIAGNOSIS — R059 Cough, unspecified: Secondary | ICD-10-CM

## 2020-09-21 DIAGNOSIS — J029 Acute pharyngitis, unspecified: Secondary | ICD-10-CM

## 2020-09-21 MED ORDER — BENZONATATE 100 MG PO CAPS: 100.0000 mg | ORAL_CAPSULE | Freq: Three times a day (TID) | ORAL | 0 refills | Status: DC

## 2020-09-21 MED ORDER — IPRATROPIUM BROMIDE 0.06 % NA SOLN
2.0000 | Freq: Four times a day (QID) | NASAL | 1 refills | Status: DC
Start: 2020-09-21 — End: 2021-02-11

## 2020-09-21 NOTE — Discharge Instructions (Signed)
  You may take 500mg acetaminophen every 4-6 hours or in combination with ibuprofen 400-600mg every 6-8 hours as needed for pain, inflammation, and fever.  Be sure to well hydrated with clear liquids and get at least 8 hours of sleep at night, preferably more while sick.   Please follow up with family medicine in 1 week if needed.   

## 2020-09-21 NOTE — ED Triage Notes (Signed)
Pt presents to [facility] with cold like symptoms; runny nose, cough, and fatigue x 3 days. Pt fully vaccinated with  [facility] April 2021. Her employer has requested a negative covid test prior to her returning to work.

## 2020-09-21 NOTE — ED Provider Notes (Signed)
[facility]    CSN: [id number] Arrival date & time: 09/21/20  9518      History   Chief Complaint Chief Complaint  Patient presents with  . Cough  . Nasal Congestion  . Fatigue  . Sore Throat    HPI Katrina Fuller is a 24 y.o. female.   HPI  Katrina Fuller is a 24 y.o. female presenting to UC with c/o 3 days of cough, congestion, rhinorrhea, sore throat, HA and fatigue.  She has been using cough drops but no other medication taken PTA. Fully vaccinated with Moderna COVID vaccine in April 2021.  Employer requiring pt to have a negative COVID test before returning to work. Denies fever, chills, n/v/d. Denies chest pain or SOB.    History reviewed. No pertinent past medical history.  Patient Active Problem List   Diagnosis Date Noted  . S/P cesarean section 10/23/2017  . Cellulitis and abscess of leg 10/07/2017  . Anemia affecting pregnancy, antepartum 08/27/2017  . History of diet controlled gestational diabetes mellitus (GDM) 04/15/2017  . Family history of breast cancer 03/20/2017  . Rh negative state in antepartum period 03/19/2017  . History of gestational hypertension 08/01/2016  . Fourth degree laceration of perineum during delivery, postpartum 07/31/2016  . Postpartum episiotomy dehiscence 07/31/2016  . Nonintractable episodic headache 01/30/2016    Past Surgical History:  Procedure Laterality Date  . CESAREAN SECTION N/A 10/23/2017   Procedure: PRIMARY CESAREAN SECTION;  Surgeon: Catalina Antigua, MD;  Location: [facility];  Service: Obstetrics;  Laterality: N/A;  . chin surgery     5 surgeries for cyst on chin    OB History    Gravida  2   Para  2   Term  2   Preterm      AB      Living  2     SAB      TAB      Ectopic      Multiple      Live Births  2        Obstetric Comments  4th degree laceration with first delivery         Home Medications    Prior to Admission medications   Medication Sig Start Date  End Date Taking? Authorizing Provider  benzonatate (TESSALON) 100 MG capsule Take 1-2 capsules (100-200 mg total) by mouth every 8 (eight) hours. 09/21/20   Lurene Shadow, PA-C  Ferrous Sulfate (IRON PO) Take by mouth.    [provider]  ipratropium (ATROVENT) 0.06 % nasal spray Place 2 sprays into both nostrils 4 (four) times daily. 09/21/20   Lurene Shadow, PA-C  Multiple Vitamin (MULTIVITAMIN) tablet Take 1 tablet by mouth daily.    [provider]    Family History Family History  Problem Relation Age of Onset  . Diabetes Mother   . Hypertension Mother   . Diverticulitis Mother   . Multiple sclerosis Father   . Diabetes Father   . Hypertension Father   . Diverticulitis Father   . Breast cancer Sister   . Breast cancer Paternal Grandmother   . Breast cancer Paternal Aunt        4 P aunts with breast cancer    Social History Social History   Tobacco Use  . Smoking status: Current Some Day Smoker    Packs/day: 0.50    Types: Cigarettes  . Smokeless tobacco: Never Used  . Tobacco comment: 3x week  Vaping Use  .  Vaping Use: Former  Substance Use Topics  . Alcohol use: No  . Drug use: No     Allergies   Latex   Review of Systems Review of Systems  Constitutional: Negative for chills and fever.  HENT: Positive for congestion, postnasal drip, rhinorrhea and sore throat. Negative for ear pain, trouble swallowing and voice change.   Respiratory: Positive for cough. Negative for shortness of breath.   Cardiovascular: Negative for chest pain and palpitations.  Gastrointestinal: Negative for abdominal pain, diarrhea, nausea and vomiting.  Musculoskeletal: Negative for arthralgias, back pain and myalgias.  Skin: Negative for rash.  All other systems reviewed and are negative.    Physical Exam Triage Vital Signs ED Triage Vitals  Enc Vitals Group     BP 09/21/20 1013 116/68     Pulse Rate 09/21/20 1013 98     Resp 09/21/20 1013 16     Temp  09/21/20 1013 98.8 F (37.1 C)     Temp Source 09/21/20 1013 Oral     SpO2 09/21/20 1013 100 %     Weight 09/21/20 1010 149 lb (67.6 kg)     Height 09/21/20 1010 4\' 11"  (1.499 m)     Head Circumference --      Peak Flow --      Pain Score --      Pain Loc --      Pain Edu? --      Excl. in GC? --    No data found.  Updated Vital Signs BP 116/68 (BP Location: Left Arm)   Pulse 98   Temp 98.8 F (37.1 C) (Oral)   Resp 16   Ht 4\' 11"  (1.499 m)   Wt 149 lb (67.6 kg)   LMP 09/20/2020 (Exact Date)   SpO2 100%   Breastfeeding No   BMI 30.09 kg/m   Visual Acuity Right Eye Distance:   Left Eye Distance:   Bilateral Distance:    Right Eye Near:   Left Eye Near:    Bilateral Near:     Physical Exam Vitals and nursing note reviewed.  Constitutional:      General: She is not in acute distress.    Appearance: She is well-developed. She is not ill-appearing, toxic-appearing or diaphoretic.  HENT:     Head: Normocephalic and atraumatic.     Right Ear: Tympanic membrane and ear canal normal.     Left Ear: Tympanic membrane and ear canal normal.     Nose: Rhinorrhea present. Rhinorrhea is clear.     Right Sinus: No maxillary sinus tenderness or frontal sinus tenderness.     Left Sinus: No maxillary sinus tenderness or frontal sinus tenderness.     Mouth/Throat:     Lips: Pink.     Mouth: Mucous membranes are moist.     Pharynx: Oropharynx is clear. Uvula midline. Posterior oropharyngeal erythema present. No pharyngeal swelling, oropharyngeal exudate or uvula swelling.  Cardiovascular:     Rate and Rhythm: Normal rate and regular rhythm.  Pulmonary:     Effort: Pulmonary effort is normal. No respiratory distress.     Breath sounds: Normal breath sounds. No stridor. No wheezing, rhonchi or rales.  Musculoskeletal:        General: Normal range of motion.     Cervical back: Normal range of motion and neck supple.  Lymphadenopathy:     Cervical: No cervical adenopathy.   Skin:    General: Skin is warm and dry.  Neurological:  Mental Status: She is alert and oriented to person, place, and time.  Psychiatric:        Behavior: Behavior normal.      UC Treatments / Results  Labs (all labs ordered are listed, but only abnormal results are displayed) Labs Reviewed  NOVEL CORONAVIRUS, NAA    EKG   Radiology No results found.  Procedures Procedures (including critical care time)  Medications Ordered in UC Medications - No data to display  Initial Impression / Assessment and Plan / UC Course  I have reviewed the triage vital signs and the nursing notes.  Pertinent labs & imaging results that were available during my care of the patient were reviewed by me and considered in my medical decision making (see chart for details).     COVID PCR test pending Hx and exam c/w viral URI Encouraged symptomatic tx AVS given along with work note  Final Clinical Impressions(s) / UC Diagnoses   Final diagnoses:  Cough  Viral URI with cough  Pharyngitis, unspecified etiology     Discharge Instructions      You may take 500mg  acetaminophen every 4-6 hours or in combination with ibuprofen 400-600mg  every 6-8 hours as needed for pain, inflammation, and fever.  Be sure to well hydrated with clear liquids and get at least 8 hours of sleep at night, preferably more while sick.   Please follow up with family medicine in 1 week if needed.     ED Prescriptions    Medication Sig Dispense Auth. Provider   benzonatate (TESSALON) 100 MG capsule Take 1-2 capsules (100-200 mg total) by mouth every 8 (eight) hours. 21 capsule , Curby Carswell O, PA-C   ipratropium (ATROVENT) 0.06 % nasal spray Place 2 sprays into both nostrils 4 (four) times daily. 15 mL Doroteo Glassman, PA-C     PDMP not reviewed this encounter.   Lurene Shadow, PA-C 09/21/20 1038

## 2020-09-22 LAB — NOVEL CORONAVIRUS, NAA: SARS-CoV-2, NAA: NOT DETECTED

## 2020-09-22 LAB — SARS-COV-2, NAA 2 DAY TAT

## 2021-01-17 ENCOUNTER — Other Ambulatory Visit: Payer: Self-pay

## 2021-01-17 ENCOUNTER — Emergency Department
Admission: EM | Admit: 2021-01-17 | Discharge: 2021-01-17 | Disposition: A | Discharge status: Home / Self Care | Payer: Self-pay | Source: Home / Self Care | Attending: Family Medicine | Admitting: Family Medicine

## 2021-01-17 DIAGNOSIS — J22 Unspecified acute lower respiratory infection: Secondary | ICD-10-CM

## 2021-01-17 MED ORDER — AMOXICILLIN 875 MG PO TABS
875.0000 mg | ORAL_TABLET | Freq: Two times a day (BID) | ORAL | 0 refills | Status: DC
Start: 2021-01-17 — End: 2021-02-11

## 2021-01-17 MED ORDER — BENZONATATE 200 MG PO CAPS: 200.0000 mg | ORAL_CAPSULE | Freq: Three times a day (TID) | ORAL | 0 refills | Status: DC

## 2021-01-17 NOTE — ED Triage Notes (Signed)
Pt presents to Urgent Care with c/o cough and nasal congestion x approx 2 weeks. Also c/o fever and headache x 3 days which began after receiving COVID booster 3 days ago. Reports taking multiple home COVID tests over the last two weeks which have all been negative.

## 2021-01-17 NOTE — Discharge Instructions (Addendum)
Drink plenty of fluids Use a humidifier if you have 1 Take Tessalon cough Take antibiotic 2 times a day Expect improvement over next few days Off work until Monday

## 2021-01-17 NOTE — ED Provider Notes (Signed)
[facility]    CSN: [id number] Arrival date & time: 01/17/21  0849      History   Chief Complaint Chief Complaint  Patient presents with  . Cough  . Nasal Congestion  . Fever    HPI Katrina Fuller is a 25 y.o. female.   HPI   Patient states she has been sick with cough cold runny nose sinus congestion sore throat for about 3 weeks.  She states the cough is persisting.  She has had 3 Covid test that were all negative.  No fever chills.  No headache or body ache.  She is getting tired of the coughing and states it is interfering with her sleep.  No sputum production.  No underlying lung disease or asthma.  Non-smoker.  History reviewed. No pertinent past medical history.  Patient Active Problem List   Diagnosis Date Noted  . S/P cesarean section 10/23/2017  . Cellulitis and abscess of leg 10/07/2017  . Anemia affecting pregnancy, antepartum 08/27/2017  . History of diet controlled gestational diabetes mellitus (GDM) 04/15/2017  . Family history of breast cancer 03/20/2017  . Rh negative state in antepartum period 03/19/2017  . History of gestational hypertension 08/01/2016  . Fourth degree laceration of perineum during delivery, postpartum 07/31/2016  . Postpartum episiotomy dehiscence 07/31/2016  . Nonintractable episodic headache 01/30/2016    Past Surgical History:  Procedure Laterality Date  . CESAREAN SECTION N/A 10/23/2017   Procedure: PRIMARY CESAREAN SECTION;  Surgeon: Catalina Antigua, MD;  Location: [facility];  Service: Obstetrics;  Laterality: N/A;  . chin surgery     5 surgeries for cyst on chin    OB History    Gravida  2   Para  2   Term  2   Preterm      AB      Living  2     SAB      IAB      Ectopic      Multiple      Live Births  2        Obstetric Comments  4th degree laceration with first delivery         Home Medications    Prior to Admission medications   Medication Sig Start Date End Date  Taking? Authorizing Provider  amoxicillin (AMOXIL) 875 MG tablet Take 1 tablet (875 mg total) by mouth 2 (two) times daily. 01/17/21  Yes Eustace Moore, MD  pseudoephedrine-acetaminophen (TYLENOL SINUS) 30-500 MG TABS tablet Take 1 tablet by mouth every 4 (four) hours as needed.   Yes [provider]  benzonatate (TESSALON) 200 MG capsule Take 1 capsule (200 mg total) by mouth every 8 (eight) hours. 01/17/21   Eustace Moore, MD  Ferrous Sulfate (IRON PO) Take by mouth.    [provider]  ipratropium (ATROVENT) 0.06 % nasal spray Place 2 sprays into both nostrils 4 (four) times daily. 09/21/20   Lurene Shadow, PA-C  Multiple Vitamin (MULTIVITAMIN) tablet Take 1 tablet by mouth daily.    [provider]    Family History Family History  Problem Relation Age of Onset  . Diabetes Mother   . Hypertension Mother   . Diverticulitis Mother   . Multiple sclerosis Father   . Diabetes Father   . Hypertension Father   . Diverticulitis Father   . Breast cancer Sister   . Breast cancer Paternal Grandmother   . Breast cancer Paternal Aunt  4 P aunts with breast cancer    Social History Social History   Tobacco Use  . Smoking status: Current Some Day Smoker    Packs/day: 0.50    Types: Cigarettes  . Smokeless tobacco: Never Used  . Tobacco comment: 3x week  Vaping Use  . Vaping Use: Former  Substance Use Topics  . Alcohol use: No  . Drug use: No     Allergies   Latex   Review of Systems Review of Systems See HPI  Physical Exam Triage Vital Signs ED Triage Vitals  Enc Vitals Group     BP 01/17/21 0916 113/81     Pulse Rate 01/17/21 0916 (!) 105     Resp 01/17/21 0916 20     Temp 01/17/21 0916 99.1 F (37.3 C)     Temp Source 01/17/21 0916 Oral     SpO2 01/17/21 0916 97 %     Weight 01/17/21 0912 152 lb (68.9 kg)     Height 01/17/21 0912 4\' 11"  (1.499 m)     Head Circumference --      Peak Flow --      Pain Score 01/17/21 0911  6     Pain Loc --      Pain Edu? --      Excl. in GC? --    No data found.  Updated Vital Signs BP 113/81 (BP Location: Right Arm)   Pulse (!) 105   Temp 99.1 F (37.3 C) (Oral)   Resp 20   Ht 4\' 11"  (1.499 m)   Wt 68.9 kg   LMP 01/03/2021 (Approximate)   SpO2 97%   BMI 30.70 kg/m      Physical Exam Constitutional:      General: She is not in acute distress.    Appearance: She is well-developed and well-nourished.     Comments: Appears tired.  Unremitting cough  HENT:     Head: Normocephalic and atraumatic.     Nose:     Comments: Mask is in place.  Oropharynx benign.  Large scar central chin    Mouth/Throat:     Mouth: Oropharynx is clear and moist.  Eyes:     Conjunctiva/sclera: Conjunctivae normal.     Pupils: Pupils are equal, round, and reactive to light.  Cardiovascular:     Rate and Rhythm: Normal rate.     Heart sounds: Normal heart sounds.  Pulmonary:     Effort: Pulmonary effort is normal. No respiratory distress.     Breath sounds: Normal breath sounds.     Comments: Lungs are clear Abdominal:     General: There is no distension.     Palpations: Abdomen is soft.  Musculoskeletal:        General: No edema. Normal range of motion.     Cervical back: Normal range of motion.  Skin:    General: Skin is warm and dry.  Neurological:     Mental Status: She is alert.  Psychiatric:        Behavior: Behavior normal.      UC Treatments / Results  Labs (all labs ordered are listed, but only abnormal results are displayed) Labs Reviewed - No data to display  EKG   Radiology No results found.  Procedures Procedures (including critical care time)  Medications Ordered in UC Medications - No data to display  Initial Impression / Assessment and Plan / UC Course  I have reviewed the triage vital signs and the nursing notes.  Pertinent labs & imaging results that were available during my care of the patient were reviewed by me and considered in my  medical decision making (see chart for details).     *Likely started as a virus.  Unlikely Covid with Covid vaccination status and 3 - Covid tests.  In any event its been 3 weeks and Covid testing is not appropriate at this time.  We will treat conservatively.  I am adding an antibiotic because her failure to respond to conservative treatment.  Return as needed Final Clinical Impressions(s) / UC Diagnoses   Final diagnoses:  LRTI (lower respiratory tract infection)     Discharge Instructions     Drink plenty of fluids Use a humidifier if you have 1 Take Tessalon cough Take antibiotic 2 times a day Expect improvement over next few days Off work until Monday   ED Prescriptions    Medication Sig Dispense Auth. Provider   benzonatate (TESSALON) 200 MG capsule Take 1 capsule (200 mg total) by mouth every 8 (eight) hours. 21 capsule Eustace Moore, MD   amoxicillin (AMOXIL) 875 MG tablet Take 1 tablet (875 mg total) by mouth 2 (two) times daily. 14 tablet Eustace Moore, MD     PDMP not reviewed this encounter.   Eustace Moore, MD 01/17/21 1007

## 2021-02-11 ENCOUNTER — Other Ambulatory Visit: Payer: Self-pay

## 2021-02-11 ENCOUNTER — Encounter: Payer: Self-pay | Admitting: *Deleted

## 2021-02-11 ENCOUNTER — Emergency Department
Admission: EM | Admit: 2021-02-11 | Discharge: 2021-02-11 | Disposition: A | Discharge status: Home / Self Care | Payer: Self-pay | Source: Home / Self Care

## 2021-02-11 DIAGNOSIS — J209 Acute bronchitis, unspecified: Secondary | ICD-10-CM

## 2021-02-11 DIAGNOSIS — J0100 Acute maxillary sinusitis, unspecified: Secondary | ICD-10-CM

## 2021-02-11 HISTORY — DX: Geographic tongue: K14.1

## 2021-02-11 MED ORDER — ALBUTEROL SULFATE HFA 108 (90 BASE) MCG/ACT IN AERS: 2.0000 | INHALATION_SPRAY | RESPIRATORY_TRACT | 0 refills | Status: DC | PRN

## 2021-02-11 MED ORDER — PROMETHAZINE-DM 6.25-15 MG/5ML PO SYRP: 5.0000 mL | ORAL_SOLUTION | Freq: Four times a day (QID) | ORAL | 0 refills | Status: DC | PRN

## 2021-02-11 MED ORDER — PREDNISONE 10 MG (21) PO TBPK: ORAL_TABLET | Freq: Every day | ORAL | 0 refills | Status: AC

## 2021-02-11 MED ORDER — AMOXICILLIN-POT CLAVULANATE 875-125 MG PO TABS: 1.0000 | ORAL_TABLET | Freq: Two times a day (BID) | ORAL | 0 refills | Status: AC

## 2021-02-11 NOTE — ED Provider Notes (Addendum)
[facility]   [id number] 02/11/21 Arrival Time: 2500   CC: COVID symptoms  SUBJECTIVE: History from: patient.  Katrina Fuller is a 25 y.o. female who presents with nasal congestion, headache, productive cough for the last week. Reports sinus pain and pressure for the last 4 days. Has been taking Robitussin with little relief. Denies sick exposure to COVID, flu or strep. Denies recent travel. Has positive history of Covid about a year ago. Has completed Covid vaccines. Has completed Covid booster. Reports 2 negative rapid covid tests. There are no aggravating or alleviating factors. Denies previous symptoms in the past. Denies fever, chills, SOB, wheezing, chest pain, nausea, changes in bowel or bladder habits.    ROS: As per HPI.  All other pertinent ROS negative.     Past Medical History:  Diagnosis Date  . Geographic tongue    Past Surgical History:  Procedure Laterality Date  . CESAREAN SECTION N/A 10/23/2017   Procedure: PRIMARY CESAREAN SECTION;  Surgeon: Catalina Antigua, MD;  Location: [facility];  Service: Obstetrics;  Laterality: N/A;  . chin surgery     5 surgeries for cyst on chin   Allergies  Allergen Reactions  . Latex Rash   No current facility-administered medications on file prior to encounter.   Current Outpatient Medications on File Prior to Encounter  Medication Sig Dispense Refill  . Ferrous Sulfate (IRON PO) Take by mouth.    . Multiple Vitamin (MULTIVITAMIN) tablet Take 1 tablet by mouth daily.     Social History   Socioeconomic History  . Marital status: Legally Separated    Spouse name: Not on file  . Number of children: Not on file  . Years of education: Not on file  . Highest education level: Not on file  Occupational History  . Occupation: customer service  Tobacco Use  . Smoking status: Current Some Day Smoker    Packs/day: 0.50    Types: Cigarettes  . Smokeless tobacco: Never Used  . Tobacco comment: 3x week  Vaping  Use  . Vaping Use: Former  Substance and Sexual Activity  . Alcohol use: No  . Drug use: No  . Sexual activity: Yes    Partners: Male    Birth control/protection: Pill  Other Topics Concern  . Not on file  Social History Narrative  . Not on file   Social Determinants of Health   Financial Resource Strain: Not on file  Food Insecurity: Not on file  Transportation Needs: Not on file  Physical Activity: Not on file  Stress: Not on file  Social Connections: Not on file  Intimate Partner Violence: Not on file   Family History  Problem Relation Age of Onset  . Diabetes Mother   . Hypertension Mother   . Diverticulitis Mother   . Multiple sclerosis Father   . Diabetes Father   . Hypertension Father   . Diverticulitis Father   . Breast cancer Sister   . Breast cancer Paternal Grandmother   . Breast cancer Paternal Aunt        4 P aunts with breast cancer    OBJECTIVE:  Vitals:   02/11/21 0818 02/11/21 0821  BP: 119/84   Pulse: 89   Resp: 16   Temp: 99 F (37.2 C)   TempSrc: Tympanic   SpO2: 96%   Weight:  172 lb 8 oz (78.2 kg)  Height:  4\' 11"  (1.499 m)     General appearance: alert; appears fatigued, but nontoxic; speaking in full  sentences and tolerating own secretions HEENT: NCAT; Ears: EACs clear, TMs pearly gray; Eyes: PERRL.  EOM grossly intact. Sinuses: nontender; Nose: nares patent with clear rhinorrhea, Throat: oropharynx erythematous, cobblestoning present, tonsils non erythematous or enlarged, uvula midline  Neck: supple without LAD Lungs: unlabored respirations, symmetrical air entry; cough: mild; no respiratory distress; mild wheezing to bilateral lung fields Heart: regular rate and rhythm.  Radial pulses 2+ symmetrical bilaterally Skin: warm and dry Psychological: alert and cooperative; normal mood and affect  LABS:  No results found for this or any previous visit (from the past 24 hour(s)).   ASSESSMENT & PLAN:  1. Acute non-recurrent  maxillary sinusitis   2. Acute bronchitis, unspecified organism     Meds ordered this encounter  Medications  . albuterol (VENTOLIN HFA) 108 (90 Base) MCG/ACT inhaler    Sig: Inhale 2 puffs into the lungs every 4 (four) hours as needed for wheezing or shortness of breath.    Dispense:  18 g    Refill:  0    Order Specific Question:   Supervising Provider    Answer:   Merrilee Jansky [id number]  . amoxicillin-clavulanate (AUGMENTIN) 875-125 MG tablet    Sig: Take 1 tablet by mouth 2 (two) times daily for 7 days.    Dispense:  14 tablet    Refill:  0    Order Specific Question:   Supervising Provider    Answer:   Merrilee Jansky [id number]  . promethazine-dextromethorphan (PROMETHAZINE-DM) 6.25-15 MG/5ML syrup    Sig: Take 5 mLs by mouth 4 (four) times daily as needed for cough.    Dispense:  118 mL    Refill:  0    Order Specific Question:   Supervising Provider    Answer:   Merrilee Jansky [id number]  . predniSONE (STERAPRED UNI-PAK 21 TAB) 10 MG (21) TBPK tablet    Sig: Take by mouth daily for 6 days. Take 6 tablets on day 1, 5 tablets on day 2, 4 tablets on day 3, 3 tablets on day 4, 2 tablets on day 5, 1 tablet on day 6    Dispense:  21 tablet    Refill:  0    Order Specific Question:   Supervising Provider    Answer:   Merrilee Jansky [[id number]]    Declines Covid testing Prescribed Augmentin BID x 7 days Prescribed albuterol inhaler Prescribed steroid taper Promethazine cough syrup prescribed Sedation precautions given Continue supportive care at home Get plenty of rest and push fluids Use OTC zyrtec for nasal congestion, runny nose, and/or sore throat Use OTC flonase for nasal congestion and runny nose Use medications daily for symptom relief Use OTC medications like ibuprofen or tylenol as needed fever or pain Call or go to the ED if you have any new or worsening symptoms such as fever, worsening cough, shortness of breath, chest tightness, chest pain, turning  blue, changes in mental status.  Reviewed expectations re: course of current medical issues. Questions answered. Outlined signs and symptoms indicating need for more acute intervention. Patient verbalized understanding. After Visit Summary given.         Moshe Cipro, NP 02/11/21 [id number]    Moshe Cipro, NP 02/11/21 [phone]

## 2021-02-11 NOTE — ED Triage Notes (Addendum)
Patient c/o 4 days of cough with yellow sputum and sore throat with "new bumps". OTC robitussin. Afebrile.

## 2021-02-11 NOTE — Discharge Instructions (Addendum)
I have sent in Augmentin for you to take twice a day for 7 days.  I have sent in a prednisone taper for you to take for 6 days. 6 tablets on day one, 5 tablets on day two, 4 tablets on day three, 3 tablets on day four, 2 tablets on day five, and 1 tablet on day six.  I have sent in cough syrup for you to take. This medication can make you sleepy. Do not drive while taking this medication.  I have sent in an albuterol inhaler for you to use 2 puffs every 4-6 hours as needed for cough, shortness of breath, wheezing.  Follow up with this office or with primary care if symptoms are persisting.  Follow up in the ER for high fever, trouble swallowing, trouble breathing, other concerning symptoms.

## 2021-10-22 ENCOUNTER — Other Ambulatory Visit: Payer: Self-pay

## 2021-10-22 ENCOUNTER — Emergency Department
Admission: EM | Admit: 2021-10-22 | Discharge: 2021-10-22 | Disposition: A | Discharge status: Home / Self Care | Payer: Self-pay | Source: Home / Self Care | Attending: Family Medicine | Admitting: Family Medicine

## 2021-10-22 ENCOUNTER — Encounter: Payer: Self-pay | Admitting: Emergency Medicine

## 2021-10-22 DIAGNOSIS — J069 Acute upper respiratory infection, unspecified: Secondary | ICD-10-CM

## 2021-10-22 DIAGNOSIS — H66002 Acute suppurative otitis media without spontaneous rupture of ear drum, left ear: Secondary | ICD-10-CM

## 2021-10-22 MED ORDER — PREDNISONE 20 MG PO TABS
20.0000 mg | ORAL_TABLET | Freq: Two times a day (BID) | ORAL | 0 refills | Status: DC
Start: 2021-10-22 — End: 2022-04-04

## 2021-10-22 MED ORDER — AMOXICILLIN 875 MG PO TABS
875.0000 mg | ORAL_TABLET | Freq: Two times a day (BID) | ORAL | 0 refills | Status: DC
Start: 2021-10-22 — End: 2022-04-04

## 2021-10-22 NOTE — Discharge Instructions (Signed)
Consider Flonase for the nasal congestion and drainage Take the antibiotic 2 times a day Take prednisone 2 times a day Drink lots of fluids Should see improvement in the next few days

## 2021-10-22 NOTE — ED Triage Notes (Signed)
Dry cough x 5 days Nasal congestion Vaccinated

## 2021-10-22 NOTE — ED Provider Notes (Signed)
[facility]    CSN: [id number] Arrival date & time: 10/22/21  0843      History   Chief Complaint Chief Complaint  Patient presents with   Cough    HPI Katrina Fuller is a 25 y.o. female.   HPI Patient has a 54-year-old and 60-year-old at home that are both in daycare.  She states they both been suffering from respiratory infections.  She states that she has a "bad cold and cold with nasal congestion, lots of nasal drainage, postnasal drip, sore throat, and cough.  This morning woke up with left ear pain.  No fever or chills.  No body aches.  No suspicion of influenza.  No exposure to COVID  Past Medical History:  Diagnosis Date   Geographic tongue     Patient Active Problem List   Diagnosis Date Noted   S/P cesarean section 10/23/2017   Cellulitis and abscess of leg 10/07/2017   Anemia affecting pregnancy, antepartum 08/27/2017   History of diet controlled gestational diabetes mellitus (GDM) 04/15/2017   Family history of breast cancer 03/20/2017   Rh negative state in antepartum period 03/19/2017   History of gestational hypertension 08/01/2016   Fourth degree laceration of perineum during delivery, postpartum 07/31/2016   Postpartum episiotomy dehiscence 07/31/2016   Nonintractable episodic headache 01/30/2016    Past Surgical History:  Procedure Laterality Date   CESAREAN SECTION N/A 10/23/2017   Procedure: PRIMARY CESAREAN SECTION;  Surgeon: Catalina Antigua, MD;  Location: [facility];  Service: Obstetrics;  Laterality: N/A;   chin surgery     5 surgeries for cyst on chin    OB History     Gravida  2   Para  2   Term  2   Preterm      AB      Living  2      SAB      IAB      Ectopic      Multiple      Live Births  2        Obstetric Comments  4th degree laceration with first delivery          Home Medications    Prior to Admission medications   Medication Sig Start Date End Date Taking? Authorizing Provider   amoxicillin (AMOXIL) 875 MG tablet Take 1 tablet (875 mg total) by mouth 2 (two) times daily. 10/22/21  Yes Eustace Moore, MD  predniSONE (DELTASONE) 20 MG tablet Take 1 tablet (20 mg total) by mouth 2 (two) times daily with a meal. 10/22/21  Yes Eustace Moore, MD  Pseudoephedrine-APAP-DM (TYLENOL COLD/FLU DAY) 30-500-15 MG TABS Take by mouth.   Yes [provider]  Ferrous Sulfate (IRON PO) Take by mouth.    [provider]  Multiple Vitamin (MULTIVITAMIN) tablet Take 1 tablet by mouth daily.    [provider]    Family History Family History  Problem Relation Age of Onset   Diabetes Mother    Hypertension Mother    Diverticulitis Mother    Multiple sclerosis Father    Diabetes Father    Hypertension Father    Diverticulitis Father    Breast cancer Sister    Breast cancer Paternal Grandmother    Breast cancer Paternal Aunt        4 P aunts with breast cancer    Social History Social History   Tobacco Use   Smoking status: Some Days    Packs/day: 0.50  Types: Cigarettes   Smokeless tobacco: Never   Tobacco comments:    3x week  Vaping Use   Vaping Use: Former  Substance Use Topics   Alcohol use: No   Drug use: No     Allergies   Latex   Review of Systems Review of Systems See HPI  Physical Exam Triage Vital Signs ED Triage Vitals  Enc Vitals Group     BP 10/22/21 0921 120/84     Pulse Rate 10/22/21 0921 (!) 106     Resp 10/22/21 0921 18     Temp 10/22/21 0921 98.5 F (36.9 C)     Temp Source 10/22/21 0921 Oral     SpO2 10/22/21 0921 99 %     Weight 10/22/21 0922 170 lb (77.1 kg)     Height 10/22/21 0922 4\' 11"  (1.499 m)     Head Circumference --      Peak Flow --      Pain Score 10/22/21 0921 7     Pain Loc --      Pain Edu? --      Excl. in GC? --    No data found.  Updated Vital Signs BP 120/84 (BP Location: Left Arm)   Pulse (!) 106   Temp 98.5 F (36.9 C) (Oral)   Resp 18   Ht 4\' 11"  (1.499 m)    Wt 77.1 kg   SpO2 99%   BMI 34.34 kg/m       Physical Exam Constitutional:      General: She is not in acute distress.    Appearance: She is well-developed.  HENT:     Head: Normocephalic and atraumatic.     Right Ear: Tympanic membrane, ear canal and external ear normal.     Left Ear: Ear canal and external ear normal.     Ears:     Comments: Left TM is dull and red    Nose: Congestion and rhinorrhea present.     Mouth/Throat:     Pharynx: No posterior oropharyngeal erythema.  Eyes:     Conjunctiva/sclera: Conjunctivae normal.     Pupils: Pupils are equal, round, and reactive to light.  Cardiovascular:     Rate and Rhythm: Normal rate.  Pulmonary:     Effort: Pulmonary effort is normal. No respiratory distress.  Abdominal:     General: There is no distension.     Palpations: Abdomen is soft.  Musculoskeletal:        General: Normal range of motion.     Cervical back: Normal range of motion.  Lymphadenopathy:     Cervical: Cervical adenopathy present.  Skin:    General: Skin is warm and dry.  Neurological:     Mental Status: She is alert.  Psychiatric:        Mood and Affect: Mood normal.        Behavior: Behavior normal.     UC Treatments / Results  Labs (all labs ordered are listed, but only abnormal results are displayed) Labs Reviewed - No data to display  EKG   Radiology No results found.  Procedures Procedures (including critical care time)  Medications Ordered in UC Medications - No data to display  Initial Impression / Assessment and Plan / UC Course  I have reviewed the triage vital signs and the nursing notes.  Pertinent labs & imaging results that were available during my care of the patient were reviewed by me and considered in my medical decision making (  see chart for details).     Final Clinical Impressions(s) / UC Diagnoses   Final diagnoses:  Viral URI with cough  Non-recurrent acute suppurative otitis media of left ear  without spontaneous rupture of tympanic membrane     Discharge Instructions      Consider Flonase for the nasal congestion and drainage Take the antibiotic 2 times a day Take prednisone 2 times a day Drink lots of fluids Should see improvement in the next few days   ED Prescriptions     Medication Sig Dispense Auth. Provider   amoxicillin (AMOXIL) 875 MG tablet Take 1 tablet (875 mg total) by mouth 2 (two) times daily. 14 tablet Eustace Moore, MD   predniSONE (DELTASONE) 20 MG tablet Take 1 tablet (20 mg total) by mouth 2 (two) times daily with a meal. 10 tablet Eustace Moore, MD      PDMP not reviewed this encounter.   Eustace Moore, MD 10/22/21 2115

## 2022-04-04 ENCOUNTER — Emergency Department
Admission: EM | Admit: 2022-04-04 | Discharge: 2022-04-04 | Disposition: A | Discharge status: Home / Self Care | Payer: Self-pay | Source: Home / Self Care

## 2022-04-04 DIAGNOSIS — H109 Unspecified conjunctivitis: Secondary | ICD-10-CM

## 2022-04-04 DIAGNOSIS — B9689 Other specified bacterial agents as the cause of diseases classified elsewhere: Secondary | ICD-10-CM

## 2022-04-04 DIAGNOSIS — J3489 Other specified disorders of nose and nasal sinuses: Secondary | ICD-10-CM

## 2022-04-04 DIAGNOSIS — J309 Allergic rhinitis, unspecified: Secondary | ICD-10-CM

## 2022-04-04 MED ORDER — PREDNISONE 20 MG PO TABS: ORAL_TABLET | ORAL | 0 refills | Status: DC

## 2022-04-04 MED ORDER — FEXOFENADINE HCL 180 MG PO TABS: 180.0000 mg | ORAL_TABLET | Freq: Every day | ORAL | 0 refills | Status: DC

## 2022-04-04 MED ORDER — MOXIFLOXACIN HCL 0.5 % OP SOLN: 1.0000 [drp] | Freq: Three times a day (TID) | OPHTHALMIC | 0 refills | Status: AC

## 2022-04-04 MED ORDER — AMOXICILLIN-POT CLAVULANATE 875-125 MG PO TABS: 1.0000 | ORAL_TABLET | Freq: Two times a day (BID) | ORAL | 0 refills | Status: DC

## 2022-04-04 NOTE — ED Provider Notes (Signed)
?[facility] ? ? ? ?CSN: [id number] ?Arrival date & time: 04/04/22  0856 ? ? ?  ? ?History   ?Chief Complaint ?Chief Complaint  ?Patient presents with  ? Nasal Congestion  ?  Nasal congestion and facial pain. X2-3 days ?Left eye redness.x1 day  ? ? ?HPI ?Katrina Fuller is a 26 y.o. female.  ? ?HPI 26 year old female presents with sinus nasal congestion and facial pain for 2 to 3 days and left eye redness for 1 day. ? ?Past Medical History:  ?Diagnosis Date  ? Geographic tongue   ? ? ?Patient Active Problem List  ? Diagnosis Date Noted  ? S/P cesarean section 10/23/2017  ? Cellulitis and abscess of leg 10/07/2017  ? Anemia affecting pregnancy, antepartum 08/27/2017  ? History of diet controlled gestational diabetes mellitus (GDM) 04/15/2017  ? Family history of breast cancer 03/20/2017  ? Rh negative state in antepartum period 03/19/2017  ? History of gestational hypertension 08/01/2016  ? Fourth degree laceration of perineum during delivery, postpartum 07/31/2016  ? Postpartum episiotomy dehiscence 07/31/2016  ? Nonintractable episodic headache 01/30/2016  ? ? ?Past Surgical History:  ?Procedure Laterality Date  ? CESAREAN SECTION N/A 10/23/2017  ? Procedure: PRIMARY CESAREAN SECTION;  Surgeon: Catalina Antigua, MD;  Location: [facility];  Service: Obstetrics;  Laterality: N/A;  ? chin surgery    ? 5 surgeries for cyst on chin  ? ? ?OB History   ? ? Gravida  ?2  ? Para  ?2  ? Term  ?2  ? Preterm  ?   ? AB  ?   ? Living  ?2  ?  ? ? SAB  ?   ? IAB  ?   ? Ectopic  ?   ? Multiple  ?   ? Live Births  ?2  ?   ?  ? Obstetric Comments  ?4th degree laceration with first delivery  ?  ? ?  ? ? ? ?Home Medications   ? ?Prior to Admission medications   ?Medication Sig Start Date End Date Taking? Authorizing Provider  ?amoxicillin-clavulanate (AUGMENTIN) 875-125 MG tablet Take 1 tablet by mouth every 12 (twelve) hours. 04/04/22  Yes Trevor Iha, FNP  ?Ferrous Sulfate (IRON PO) Take by mouth.   Yes [provider]  ?fexofenadine (ALLEGRA ALLERGY) 180 MG tablet Take 1 tablet (180 mg total) by mouth daily for 15 days. 04/04/22 04/19/22 Yes Trevor Iha, FNP  ?moxifloxacin (VIGAMOX) 0.5 % ophthalmic solution Place 1 drop into both eyes 3 (three) times daily for 5 days. 04/04/22 04/09/22 Yes Trevor Iha, FNP  ?Multiple Vitamin (MULTIVITAMIN) tablet Take 1 tablet by mouth daily.   Yes [provider]  ?predniSONE (DELTASONE) 20 MG tablet Take 3 tabs PO daily x 5 days. 04/04/22  Yes Trevor Iha, FNP  ? ? ?Family History ?Family History  ?Problem Relation Age of Onset  ? Diabetes Mother   ? Hypertension Mother   ? Diverticulitis Mother   ? Multiple sclerosis Father   ? Diabetes Father   ? Hypertension Father   ? Diverticulitis Father   ? Breast cancer Sister   ? Breast cancer Paternal Grandmother   ? Breast cancer Paternal Aunt   ?     4 P aunts with breast cancer  ? ? ?Social History ?Social History  ? ?Tobacco Use  ? Smoking status: Former  ?  Packs/day: 0.50  ?  Types: Cigarettes  ? Smokeless tobacco: Never  ? Tobacco comments:  ?  3x week  ?Vaping Use  ? Vaping Use: Every day  ?Substance Use Topics  ? Alcohol use: No  ? Drug use: No  ? ? ? ?Allergies   ?Latex ? ? ?Review of Systems ?Review of Systems  ?HENT:  Positive for congestion, sinus pressure and sinus pain.   ?Eyes:  Positive for redness.  ?All other systems reviewed and are negative. ? ? ?Physical Exam ?Triage Vital Signs ?ED Triage Vitals  ?Enc Vitals Group  ?   BP 04/04/22 0919 125/87  ?   Pulse Rate 04/04/22 0919 (!) 108  ?   Resp 04/04/22 0919 18  ?   Temp 04/04/22 0919 98.3 ?F (36.8 ?C)  ?   Temp Source 04/04/22 0919 Oral  ?   SpO2 04/04/22 0919 96 %  ?   Weight 04/04/22 0916 160 lb (72.6 kg)  ?   Height 04/04/22 0916 5' (1.524 m)  ?   Head Circumference --   ?   Peak Flow --   ?   Pain Score 04/04/22 0916 7  ?   Pain Loc --   ?   Pain Edu? --   ?   Excl. in GC? --   ? ?No data found. ? ?Updated Vital Signs ?BP 125/87 (BP Location: Right Arm)    Pulse (!) 108   Temp 98.3 ?F (36.8 ?C) (Oral)   Resp 18   Ht 5' (1.524 m)   Wt 160 lb (72.6 kg)   LMP 03/14/2022   SpO2 96%   BMI 31.25 kg/m?  ? ? ? ?Physical Exam ?Vitals and nursing note reviewed.  ?Constitutional:   ?   Appearance: Normal appearance.  ?HENT:  ?   Head: Normocephalic and atraumatic.  ?   Right Ear: Tympanic membrane and external ear normal.  ?   Left Ear: Tympanic membrane and external ear normal.  ?   Ears:  ?   Comments: Moderate eustachian tube dysfunction noted bilaterally ?   Nose:  ?   Comments: Turbinates are erythematous/edematous ?   Mouth/Throat:  ?   Mouth: Mucous membranes are moist.  ?   Pharynx: Oropharynx is clear.  ?   Comments: Moderate to significant amount of clear drainage of posterior oropharynx noted ?Eyes:  ?   Extraocular Movements: Extraocular movements intact.  ?   Conjunctiva/sclera: Conjunctivae normal.  ?   Pupils: Pupils are equal, round, and reactive to light.  ?   Comments: Bilateral eyes: Sclera with +3 injection bilaterally  ?Cardiovascular:  ?   Rate and Rhythm: Normal rate and regular rhythm.  ?   Pulses: Normal pulses.  ?   Heart sounds: Normal heart sounds. No murmur heard. ?Pulmonary:  ?   Effort: Pulmonary effort is normal.  ?   Breath sounds: Normal breath sounds. No wheezing, rhonchi or rales.  ?Musculoskeletal:  ?   Cervical back: Normal range of motion and neck supple.  ?Skin: ?   General: Skin is warm and dry.  ?Neurological:  ?   General: No focal deficit present.  ?   Mental Status: She is alert and oriented to person, place, and time.  ? ? ? ?UC Treatments / Results  ?Labs ?(all labs ordered are listed, but only abnormal results are displayed) ?Labs Reviewed - No data to display ? ?EKG ? ? ?Radiology ?No results found. ? ?Procedures ?Procedures (including critical care time) ? ?Medications Ordered in UC ?Medications - No data to display ? ?Initial Impression / Assessment and Plan /  UC Course  ?I have reviewed the triage vital signs and the  nursing notes. ? ?Pertinent labs & imaging results that were available during my care of the patient were reviewed by me and considered in my medical decision making (see chart for details). ? ?  ? ?MDM: 1.  Acute bacterial rhinosinusitis-Rx'd Augmentin; 2.  Sinus pressure-Rx'd prednisone; 3.  Allergic rhinitis-Rx'd Allegra; 4.  Conjunctivitis of both eyes, unspecified conjunctivitis type-Rx'd Vigamox. Instructed patient to take medication as directed with food to completion.  Advised patient to take prednisone and Allegra with first dose of Augmentin for the next 5 of 7 days.  Advised may use Allegra as needed afterwards for concurrent postnasal drainage/drip.  Instructed patient to instill eyedrops as directed.  Encouraged patient to increase daily water intake while taking these medications.  Advised patient if symptoms worsen and or unresolved please follow-up with PCP, optometry or here for further evaluation.  Work note provided prior to discharge.  Patient discharged home, hemodynamically stable ?Final Clinical Impressions(s) / UC Diagnoses  ? ?Final diagnoses:  ?Acute bacterial rhinosinusitis  ?Sinus pressure  ?Allergic rhinitis, unspecified seasonality, unspecified trigger  ?Conjunctivitis of both eyes, unspecified conjunctivitis type  ? ? ? ?Discharge Instructions   ? ?  ?Instructed patient to take medication as directed with food to completion.  Advised patient to take prednisone and Allegra with first dose of Augmentin for the next 5 of 7 days.  Advised may use Allegra as needed afterwards for concurrent postnasal drainage/drip.  Instructed patient to instill eyedrops as directed.  Encouraged patient to increase daily water intake while taking these medications.  Advised patient if symptoms worsen and or unresolved please follow-up with PCP, optometry or here for further evaluation. ? ? ? ? ?ED Prescriptions   ? ? Medication Sig Dispense Auth. Provider  ? amoxicillin-clavulanate (AUGMENTIN) 875-125 MG  tablet Take 1 tablet by mouth every 12 (twelve) hours. 14 tablet Trevor Iha, FNP  ? predniSONE (DELTASONE) 20 MG tablet Take 3 tabs PO daily x 5 days. 15 tablet Trevor Iha, FNP  ? fexofenadine (ALLEGRA ALLE

## 2022-04-04 NOTE — ED Triage Notes (Signed)
Pt states that she has some nasal congestion and facial pain. X2-3 days ? ?Pt states that she also has some redness of her left eye. X1 day ? ? ?Pt states that she is vaccinated for covid.  ?Pt states that she has had flu vaccine.  ? ?Pt states that she had a negative home covid test x2-3 days ago.  ?

## 2022-04-04 NOTE — Discharge Instructions (Addendum)
Instructed patient to take medication as directed with food to completion.  Advised patient to take prednisone and Allegra with first dose of Augmentin for the next 5 of 7 days.  Advised may use Allegra as needed afterwards for concurrent postnasal drainage/drip.  Instructed patient to instill eyedrops as directed.  Encouraged patient to increase daily water intake while taking these medications.  Advised patient if symptoms worsen and or unresolved please follow-up with PCP, optometry or here for further evaluation. ?

## 2024-04-04 ENCOUNTER — Ambulatory Visit
Admission: EM | Admit: 2024-04-04 | Discharge: 2024-04-04 | Disposition: A | Discharge status: Home / Self Care | Payer: Self-pay | Attending: Family Medicine | Admitting: Family Medicine

## 2024-04-04 ENCOUNTER — Other Ambulatory Visit: Payer: Self-pay

## 2024-04-04 DIAGNOSIS — H9202 Otalgia, left ear: Secondary | ICD-10-CM

## 2024-04-04 DIAGNOSIS — R030 Elevated blood-pressure reading, without diagnosis of hypertension: Secondary | ICD-10-CM

## 2024-04-04 DIAGNOSIS — H60312 Diffuse otitis externa, left ear: Secondary | ICD-10-CM

## 2024-04-04 HISTORY — DX: Body dysmorphic disorder: F45.22

## 2024-04-04 HISTORY — DX: Bulimia nervosa, unspecified: F50.20

## 2024-04-04 MED ORDER — AMOXICILLIN-POT CLAVULANATE 875-125 MG PO TABS: 1.0000 | ORAL_TABLET | Freq: Two times a day (BID) | ORAL | 0 refills | Status: AC

## 2024-04-04 MED ORDER — NEOMYCIN-POLYMYXIN-HC 3.5-10000-1 OT SUSP: 4.0000 [drp] | Freq: Three times a day (TID) | OTIC | 0 refills | Status: AC

## 2024-04-04 NOTE — Discharge Instructions (Signed)
 Take the antibiotic 2 times a day for a week Use the eardrops 3 times a day until ear pain is gone See your doctor if not improved by next week Your blood pressure was elevated today.  Make sure that you follow-up on this

## 2024-04-04 NOTE — ED Triage Notes (Signed)
 Left ear pain x week. Today sinuses started hurting. No fever. Has taken zyrtec which she takes daily.

## 2024-04-04 NOTE — ED Provider Notes (Signed)
 [facility]    CSN: [id number] Arrival date & time: 04/04/24  1636      History   Chief Complaint Chief Complaint  Patient presents with   Ear Fullness    HPI Katrina Fuller is a 28 y.o. female.   HPI  Patient has history of slowly increasing ear pain on the left for the last week.  Hearing is diminished.  Today started having sinus congestion.  No known allergies.  No recurring problems with ear or sinus problems. Patient denies problems with her blood pressure.  Her medical history does include gestational hypertension.  Patient does not think she has blood pressure problems at this time.  Past Medical History:  Diagnosis Date   Body dysmorphic disorder    Bulimia nervosa    Geographic tongue     Patient Active Problem List   Diagnosis Date Noted   S/P cesarean section 10/23/2017   Cellulitis and abscess of leg 10/07/2017   Anemia affecting pregnancy, antepartum 08/27/2017   History of diet controlled gestational diabetes mellitus (GDM) 04/15/2017   Family history of breast cancer 03/20/2017   Rh negative state in antepartum period 03/19/2017   History of gestational hypertension 08/01/2016   Fourth degree laceration of perineum during delivery, postpartum 07/31/2016   Postpartum episiotomy dehiscence 07/31/2016   Nonintractable episodic headache 01/30/2016    Past Surgical History:  Procedure Laterality Date   CESAREAN SECTION N/A 10/23/2017   Procedure: PRIMARY CESAREAN SECTION;  Surgeon: Verlyn Goad, MD;  Location: [facility];  Service: Obstetrics;  Laterality: N/A;   chin surgery     5 surgeries for cyst on chin    OB History     Gravida  2   Para  2   Term  2   Preterm      AB      Living  2      SAB      IAB      Ectopic      Multiple      Live Births  2        Obstetric Comments  4th degree laceration with first delivery          Home Medications    Prior to Admission medications   Medication  Sig Start Date End Date Taking? Authorizing Provider  amoxicillin -clavulanate (AUGMENTIN ) 875-125 MG tablet Take 1 tablet by mouth every 12 (twelve) hours. 04/04/24  Yes Stephany Ehrich, MD  cetirizine (ZYRTEC) 10 MG tablet Take 10 mg by mouth daily.   Yes [provider]  neomycin-polymyxin-hydrocortisone (CORTISPORIN) 3.5-10000-1 OTIC suspension Place 4 drops into the left ear 3 (three) times daily. 04/04/24  Yes Stephany Ehrich, MD  Multiple Vitamin (MULTIVITAMIN) tablet Take 1 tablet by mouth daily.    [provider]    Family History Family History  Problem Relation Age of Onset   Diabetes Mother    Hypertension Mother    Diverticulitis Mother    Multiple sclerosis Father    Diabetes Father    Hypertension Father    Diverticulitis Father    Breast cancer Sister    Breast cancer Paternal Grandmother    Breast cancer Paternal Aunt        4 P aunts with breast cancer    Social History Social History   Tobacco Use   Smoking status: Some Days    Current packs/day: 0.50    Types: Cigarettes   Smokeless tobacco: Never   Tobacco comments:  3x week  Vaping Use   Vaping status: Every Day  Substance Use Topics   Alcohol use: No   Drug use: No     Allergies   Latex   Review of Systems Review of Systems  See HPI Physical Exam Triage Vital Signs ED Triage Vitals  Encounter Vitals Group     BP 04/04/24 1640 (!) 186/120     Systolic BP Percentile --      Diastolic BP Percentile --      Pulse Rate 04/04/24 1640 (!) 109     Resp 04/04/24 1640 16     Temp 04/04/24 1640 98.9 F (37.2 C)     Temp src --      SpO2 04/04/24 1640 95 %     Weight --      Height --      Head Circumference --      Peak Flow --      Pain Score 04/04/24 1645 8     Pain Loc --      Pain Education --      Exclude from Growth Chart --    No data found.  Updated Vital Signs BP (!) 167/131   Pulse (!) 106   Temp 98.9 F (37.2 C)   Resp 16   SpO2 95%        Physical Exam Constitutional:      General: She is not in acute distress.    Appearance: She is well-developed.     Comments: Overweight.  HENT:     Head: Normocephalic.     Comments: Scar on lower lip and chin    Right Ear: Tympanic membrane and ear canal normal.     Ears:     Comments: There is pain with traction of the pinna.  Erythema and slight swelling of the canal.  Dull eardrum.    Nose: No rhinorrhea.     Mouth/Throat:     Mouth: Mucous membranes are moist.     Pharynx: No posterior oropharyngeal erythema.     Comments: Geographic tongue Eyes:     Conjunctiva/sclera: Conjunctivae normal.     Pupils: Pupils are equal, round, and reactive to light.  Cardiovascular:     Rate and Rhythm: Normal rate.  Pulmonary:     Effort: Pulmonary effort is normal. No respiratory distress.  Abdominal:     General: There is no distension.     Palpations: Abdomen is soft.  Musculoskeletal:        General: Normal range of motion.     Cervical back: Normal range of motion.  Lymphadenopathy:     Cervical: No cervical adenopathy.  Skin:    General: Skin is warm and dry.  Neurological:     Mental Status: She is alert.      UC Treatments / Results  Labs (all labs ordered are listed, but only abnormal results are displayed) Labs Reviewed - No data to display  EKG   Radiology No results found.  Procedures Procedures (including critical care time)  Medications Ordered in UC Medications - No data to display  Initial Impression / Assessment and Plan / UC Course  I have reviewed the triage vital signs and the nursing notes.  Pertinent labs & imaging results that were available during my care of the patient were reviewed by me and considered in my medical decision making (see chart for details).    Emphasized to patient she needs to follow-up on her blood pressure Final Clinical  Impressions(s) / UC Diagnoses   Final diagnoses:  Acute otalgia, left  Acute diffuse otitis  externa of left ear  Elevated blood pressure reading     Discharge Instructions      Take the antibiotic 2 times a day for a week Use the eardrops 3 times a day until ear pain is gone See your doctor if not improved by next week Your blood pressure was elevated today.  Make sure that you follow-up on this   ED Prescriptions     Medication Sig Dispense Auth. Provider   amoxicillin -clavulanate (AUGMENTIN ) 875-125 MG tablet Take 1 tablet by mouth every 12 (twelve) hours. 14 tablet Stephany Ehrich, MD   neomycin-polymyxin-hydrocortisone (CORTISPORIN) 3.5-10000-1 OTIC suspension Place 4 drops into the left ear 3 (three) times daily. 10 mL Stephany Ehrich, MD      PDMP not reviewed this encounter.   Stephany Ehrich, MD 04/04/24 [phone]
